# Patient Record
Sex: Male | Born: 1955 | Race: White | Hispanic: No | Smoking: Former smoker
Health system: Southern US, Community
[De-identification: ages and names within clinical notes are randomized; demographics above are authoritative.]

## PROBLEM LIST (undated history)

## (undated) DIAGNOSIS — E119 Type 2 diabetes mellitus without complications: Secondary | ICD-10-CM

## (undated) DIAGNOSIS — I10 Essential (primary) hypertension: Secondary | ICD-10-CM

## (undated) DIAGNOSIS — I219 Acute myocardial infarction, unspecified: Secondary | ICD-10-CM

## (undated) DIAGNOSIS — A879 Viral meningitis, unspecified: Secondary | ICD-10-CM

## (undated) DIAGNOSIS — L409 Psoriasis, unspecified: Secondary | ICD-10-CM

## (undated) DIAGNOSIS — B029 Zoster without complications: Secondary | ICD-10-CM

## (undated) HISTORY — PX: CHOLECYSTECTOMY: SHX55

---

## 1998-02-14 ENCOUNTER — Inpatient Hospital Stay (HOSPITAL_COMMUNITY): Admission: EM | Admit: 1998-02-14 | Discharge: 1998-02-17 | Payer: Self-pay | Admitting: Emergency Medicine

## 1998-03-07 ENCOUNTER — Ambulatory Visit (HOSPITAL_COMMUNITY): Admission: RE | Admit: 1998-03-07 | Discharge: 1998-03-07 | Payer: Self-pay | Admitting: General Surgery

## 2001-02-09 ENCOUNTER — Emergency Department (HOSPITAL_COMMUNITY): Admission: EM | Admit: 2001-02-09 | Discharge: 2001-02-09 | Payer: Self-pay | Admitting: Internal Medicine

## 2001-02-09 ENCOUNTER — Encounter: Payer: Self-pay | Admitting: Internal Medicine

## 2001-12-30 ENCOUNTER — Encounter: Payer: Self-pay | Admitting: Emergency Medicine

## 2001-12-30 ENCOUNTER — Inpatient Hospital Stay (HOSPITAL_COMMUNITY): Admission: EM | Admit: 2001-12-30 | Discharge: 2002-01-03 | Payer: Self-pay | Admitting: Emergency Medicine

## 2002-01-02 ENCOUNTER — Encounter: Payer: Self-pay | Admitting: Internal Medicine

## 2002-01-09 ENCOUNTER — Encounter: Payer: Self-pay | Admitting: Family Medicine

## 2002-01-09 ENCOUNTER — Ambulatory Visit (HOSPITAL_COMMUNITY): Admission: RE | Admit: 2002-01-09 | Discharge: 2002-01-09 | Payer: Self-pay | Admitting: Family Medicine

## 2003-09-06 ENCOUNTER — Emergency Department (HOSPITAL_COMMUNITY): Admission: EM | Admit: 2003-09-06 | Discharge: 2003-09-06 | Payer: Self-pay | Admitting: Emergency Medicine

## 2006-06-24 ENCOUNTER — Emergency Department (HOSPITAL_COMMUNITY): Admission: EM | Admit: 2006-06-24 | Discharge: 2006-06-24 | Payer: Self-pay | Admitting: Emergency Medicine

## 2006-12-09 ENCOUNTER — Inpatient Hospital Stay (HOSPITAL_BASED_OUTPATIENT_CLINIC_OR_DEPARTMENT_OTHER): Admission: RE | Admit: 2006-12-09 | Discharge: 2006-12-09 | Payer: Self-pay | Admitting: Cardiology

## 2007-09-29 DIAGNOSIS — A879 Viral meningitis, unspecified: Secondary | ICD-10-CM

## 2007-09-29 HISTORY — DX: Viral meningitis, unspecified: A87.9

## 2008-02-20 ENCOUNTER — Inpatient Hospital Stay (HOSPITAL_COMMUNITY): Admission: EM | Admit: 2008-02-20 | Discharge: 2008-02-24 | Payer: Self-pay | Admitting: Emergency Medicine

## 2008-03-21 ENCOUNTER — Emergency Department (HOSPITAL_COMMUNITY): Admission: EM | Admit: 2008-03-21 | Discharge: 2008-03-21 | Payer: Self-pay | Admitting: Emergency Medicine

## 2011-02-10 NOTE — Discharge Summary (Signed)
NAME:  Erik George, Erik George                ACCOUNT NO.:  [id number]   MEDICAL RECORD NO.:  [id number]          PATIENT TYPE:  INP   LOCATION:  A322                          FACILITY:  [facility]   PHYSICIAN:  Dorris Singh, DO    DATE OF BIRTH:  1956/07/29   DATE OF ADMISSION:  02/20/2008  DATE OF DISCHARGE:  05/29/2009LH                               DISCHARGE SUMMARY   PRIMARY CARE PHYSICIAN:  He does not have one.  He goes to [facility].   ADMISSION DIAGNOSES:  1. Headache and fever.  2. Possible [facility] spotted fever disease.  3. Possible right lower lobe pneumonia.  4. History of hypertension, diabetes and obesity.   DISCHARGE DIAGNOSES:  1. Lyme disease.  2. Possible [facility] spotted fever.  3. Hypertension.  4. Diabetes.  5. Obesity.  6. Headaches, which were resolving.   His H&P was done by Dr. Rito Ehrlich.  Please refer.  The patient had  several tests done.  His chest x-ray on May 25 demonstrated a possible  right suprahilar infiltrate or small mass.  Short-term radiographic  follow-up was recommended.  If this persists after appropriate therapy,  CT would exclude neoplasm.  Probable scarring of the left lung base.  His CT of the head without contrast demonstrated no acute intracranial  findings, mild mucosal thickening and possible small mucus retention in  a cyst of the paranasal sinuses is described.  He had a of fluoroscopic-  guided lumbar puncture on May 26.   CONSULTATIONS:  Kofi A. Gerilyn Pilgrim, M.D., of neurology.   HOSPITAL COURSE:  The patient was admitted with the above diagnoses.  There was some concern as to whether or not the patient had Lyme disease  because he is status post a tick bite about 2 weeks ago and his headache  had started about 4 days.  They were concerned about it being bacterial  meningitis.  At that point in time after the lumbar puncture, Dr.  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  At that point time it was determined that the  patient probably should get a PICC because if we need to do long-term  therapy, then we can continue that.  There was some concern about him  wheezing.  However, he was put on nebulizer treatments that seemed to  resolve it.  While he was here with his diabetes, he was placed on his  home medications as well as for his blood pressure.  The patient's  headache continued to improve slowly and did get some benefit with  hydration.  On the 29th it was determined he could be discharged.  His  Lyme disease titer came back negative.  However, we are awaiting the PCR  Lyme disease titer as well and the [facility] spotted fever titer.  Talking with Dr. Gerilyn Pilgrim, there is still high suspicion for these  diseases and since he has gotten better with the treatment, we will go  ahead and treat him for both and send him home.   He will be sent home on his home medications, which include:  1. Benicar 20/12.5 mg tablets daily.  2. Diclofenac 75 mg one p.o. b.i.d.  3. He is going to be on Glucovance 5/500 mg two times a day.   We will place him on doxycycline 100 mg p.o. b.i.d. x10 days as well as  ceftriaxone 2 g IV daily for the next 3 weeks.  The patient will have a  PICC line placed prior to discharge, and he is recommend to follow up  with Dr. Gerilyn Pilgrim in 1 week as well with this primary care physician in  1 week.   CONDITION AT DISCHARGE:  Stable.   DISPOSITION:  To home.  He is to return if any symptoms worsen.      Dorris Singh, DO  Electronically Signed     CB/MEDQ  D:  02/24/2008  T:  02/24/2008  Job:  [id number]   cc:   [facility] Department

## 2011-02-10 NOTE — Group Therapy Note (Signed)
NAME:  Erik George, Erik George                ACCOUNT NO.:  [id number]   MEDICAL RECORD NO.:  [id number]          PATIENT TYPE:  INP   LOCATION:  A322                          FACILITY:  [facility]   PHYSICIAN:  Osvaldo Shipper, MD     DATE OF BIRTH:  March 03, 1956   DATE OF PROCEDURE:  02/21/2008  DATE OF DISCHARGE:                                 PROGRESS NOTE   SUBJECTIVE:  The patient still complaining of 8/10 headache though he  says with the oxycodone it does get a little bit better.  He denies any  more nausea, vomiting.  No neck stiffness is present.  Denies any chest  pain or shortness of breath.   PHYSICAL EXAMINATION:  VITAL SIGNS:  He continues to run a temperature,  T-max 102.8.  Heart rate 83, respiratory rate 20, blood pressure on  133/84, saturation 95% on room air.  CBGs are running stable.  LUNGS:  His lungs reveal a few scattered wheezes bilaterally.  CARDIOVASCULAR:  Cardiovascular system is normal regular.  GENERAL:  He is sweating profusely.  No neck stiffness present.  No  focal weakness is present.   LABORATORY DATA:  His white count continues to be normal.  Hemoglobin is  7.8.  His BMET is also unremarkable.  Potassium has come up to 4.2.   An LP is pending at this time.   ASSESSMENT/PLAN:  1. Headaches with fever.  This is status post a tick bite 2 weeks ago.      His headache started 4 days ago.  It does not look like bacterial      meningitis.  An LP yesterday could not be done because of his obese      status, and no landmarks could be identified.  He is scheduled for      LP under fluoroscopy today.  He was seen by Dr. Gerilyn Pilgrim, who also      feels that this is not bacterial meningitis and could be Lyme      disease or [facility] spotted fever meningitis.  So the patient      is on doxycycline.  He is also on ceftriaxone, both of which will      be continued for now.  Await results from the LP to decide further      course of management.  Motrin will be started for  his headaches.  2. Wheezing.  He has been a smoker, quit about 3 months ago.  Has      almost a 40-pack-year history of smoking and so we will give him      some nebulizer treatments for now.  3. History of type 2 diabetes, stable.  He was on metformin at home,      but I am holding these medications for now.  4. History of hypertension, also stable.  I will hold off on his blood      pressure medications for now as well.  5. He is obese.  He has had pneumonia in the past, he has psoriasis,      all of which are quite  stable.   So we await LP on this gentleman before deciding further course of  action.      Osvaldo Shipper, MD  Electronically Signed     GK/MEDQ  D:  02/21/2008  T:  02/21/2008  Job:  [id number]   cc:   Darleen Crocker A. Gerilyn Pilgrim, M.D.  Fax: [phone]

## 2011-02-10 NOTE — Consult Note (Signed)
NAME:  Erik George, Erik George                ACCOUNT NO.:  [id number]   MEDICAL RECORD NO.:  [id number]          PATIENT TYPE:  INP   LOCATION:  A322                          FACILITY:  [facility]   PHYSICIAN:  Kofi A. Gerilyn Pilgrim, M.D. DATE OF BIRTH:  1955/12/07   DATE OF CONSULTATION:  DATE OF DISCHARGE:                                 CONSULTATION   HISTORY OF PRESENT ILLNESS:  The patient is a 55 year old white male who  probably was bitten by a tick about 2 weeks ago.  It had been on the  left flank region.  He  currently had a rash therapy, subsequently he  had a discoloration at that site where the tick had bitten him.  About 4  days prior to presenting to the hospital, the patient developed  significant headaches which is in his entire head.  He reports having  body aches.  He described the intensity of headache has been 8/10.  He  took the current medications without much relief.  The patient's started  to getting nausea, vomiting and presenting to the hospital after he  developed significant fevers.  No focal or neurological symptoms  reported.  No seizures.  He has not traveled outside the [place]  The  patient was noted to have right lower lobe infiltrate/pneumonia on the  chest x-ray, this felt related to his nausea and vomiting.   PAST MEDICAL HISTORY:  Significant for diabetes, hypertension, obesity,  pneumonia in the past, and psoriasis.   PAST SURGICAL HISTORY:  Status post cholecystectomy.   ADMISSION MEDICATIONS:  1. Benicar.  2. Aspirin.  3. Glucophage.  4. Topical creams for psoriasis.  5. Also over-the-couter ibuprofen.   SOCIAL HISTORY:  He lives in [place].  No smoking, alcohol, or  illicit drug use.  He is currently unemployed.   FAMILY HISTORY:  Positive for diabetes.   REVIEW OF SYSTEMS:  Essentially unrevealing other than stated in the  history of present illness.   PHYSICAL EXAMINATION:  GENERAL:  He is an obese man who has a discomfort  from headaches.  VITAL  SIGNS:  Temperature 102.1, pulse 83, respirations 20, and blood  pressure 132/84.  HEENT:  Head is normocephalic and atraumatic.  NECK:  Supple.  EXTREMITIES:  Psoriatic lesions involving the extensor surfaces of the  elbows and knees, especially on the right side.  TRUNK EXAM:  Shows a crusted areas involving the left flank region  measured about 1 cm.  This area where he had the tick bite, there is no  associated rash, however.  ABDOMEN:  Soft, no rashes other than his psoriasis noted on the trunk.  NEUROLOGIC:  The patient is awake, alert.  He converses well.  Speech,  language, and cognition are intact.  CRANIAL NERVES EVALUATION:  Pupils are equal, round, and reactive to  light and accommodation.  Visual fields are intact.  Extraocular  movements were full.  Face and muscle strength is symmetric.  Tongue is  midline.  Uvula is midline.  Shoulder shrug is normal.  Motor  examination shows normal tone, bulk, and strength.  There is  no pronator  drift.  Coordination is intact.  Reflexes are diminished throughout.  The patient has symmetric.   ASSESSMENT:  1. Likely meningitis, although I did not suspect this patient has      fulminant acute bacterial meningitis, likely this is a subacute      meningitis problem.  2. Tick-borne process (Lyme disease, or richettsial infections).  The      patient is currently on meningitic doses of ceftriaxone.  He is      also on doxycycline.  Apparently, on Zithromycin because of      suspected pneumonia.  Continue with these.  He is due to have a      lumbar spinal tap under fluoroscopy as attempts were unsuccessful.      We will add Lyme PCR and he also should have Lyme titers and      richettsial serum antibodies.      Kofi A. Gerilyn Pilgrim, M.D.  Electronically Signed     KAD/MEDQ  D:  02/21/2008  T:  02/22/2008  Job:  [id number]

## 2011-02-10 NOTE — H&P (Signed)
NAME:  Erik George, Erik George NO.:  [id number]   MEDICAL RECORD NO.:  [id number]          PATIENT TYPE:  EMS   LOCATION:  ED                            FACILITY:  [facility]   PHYSICIAN:  Osvaldo Shipper, MD     DATE OF BIRTH:  1955/12/28   DATE OF ADMISSION:  02/20/2008  DATE OF DISCHARGE:  LH                              HISTORY & PHYSICAL   PRIMARY CARE PHYSICIAN:  [facility] Department.  Does not  have a PMD.   ADMISSION DIAGNOSES:  1. Headache and fevers.  2. Possible [place] Spotted Disease.  3. Possible right lower lobe pneumonia.  4. History of hypertension, diabetes and obesity.   CHIEF COMPLAINT:  Headache and fevers for 4 days.   HISTORY OF PRESENT ILLNESS:  The patient is a 55 year old Caucasian male  who says that he had a tick bite to his left flank area about 2 weeks  ago.  He felt okay.  Since that bite he did not notice any rash.  He  does have psoriasis but did not notice any new rash.  Four days ago, the  patient started getting a headache all over his head.  The pain is  described as an achy kind of pain, 8/10 intensity.  He took some  ibuprofen for the headache with no relief.  Headache got worse and then  he started having nausea and vomiting as well, and he decided to come  into the hospital today.  He also started getting high fevers as well.  He denies any abdominal pain.  Denies any chest pain, shortness of  breath, cough.  Denies any seizure-type activity.  Denies any focal  weakness.  No sick contacts.  No recent travel outside this area.   MEDICATIONS AT HOME:  1. Aspirin 81 mg daily.  2. Benicar 5 mg daily.  3. Glucovance 500 b.i.d.  4. He takes a cream, applied the cream for psoriasis.  5. He took some ibuprofen over-the-counter for his headache.   ALLERGIES:  NO KNOWN DRUG ALLERGIES.   PAST MEDICAL HISTORY:  1. Diabetes.  2. Obesity.  3. Hypertension.  4  Pneumonia in the past.  1. Psoriasis.   PAST SURGICAL  HISTORY:  He had a cholecystectomy in the past.   SOCIAL HISTORY:  Lives in [place].  No smoking, alcohol or illicit  drug use.  He is currently unemployed.   FAMILY HISTORY:  Positive for diabetes.   REVIEW OF SYSTEMS:  GENERAL:  System positive for weakness.  HEENT: As  in HPI.  GI: As in HPI.  GU: Unremarkable.  RESPIRATORY:  Unremarkable.  CARDIOVASCULAR:  Unremarkable.  PSYCHIATRIC:  Unremarkable.  NEUROLOGICAL:  As in HPI.  Other systems unremarkable.   PHYSICAL EXAMINATION:  VITAL SIGNS: Temperature was 103 and he presented  going down to 101.2, blood pressure was 139/75, heart rate 93,  respiratory 24, saturation 96% on room air.  GENERAL:  This is an obese white male in considerable discomfort but in  no distress.  HEENT: There is no pallor, no icterus.  Oral mucous membrane  is moist.  No oral lesions are noted.  The patient has sweating profusely.  NECK:  Soft and supple.  No rigidity is noted.  No thyromegaly is  appreciated.  LUNGS:  Clear.  No wheezing is present.  Few crackles at the right base.  Otherwise, mostly clear to auscultation.  Cardiovascular system is  normal regular.  No murmurs appreciated.  ABDOMEN:  Soft, nontender, nondistended.  Bowel sounds present.  No mass  or organomegaly is appreciated.  SKIN:  There is a tick bite mark noted in the left flank area.  No  erythema is noted.  The area is mildly tender.  He also has psoriasis  rash in his elbow area as well as in his navel area on his abdomen and  some in his lower extremity as well.  NEUROLOGICAL:  He is alert, somnolent, easily arousable, oriented x3.  No focal neurological deficits are present.   LABORATORY DATA:  His white count is normal, hemoglobin is 13.4, MCV is  89, platelet count is 160, PTT is 50, potassium 3.4, glucose 150,  bilirubin is 1.6, alk phos is 25.  Other LFTs are normal.  Lipase is  normal.  UA shows a small bilirubin, trace ketones, moderate blood, no  evidence for  infection.  He had chest x-ray which shows possible right  suprahilar infiltrative small mass, possible scattered scarring of the  left lung base noted.  CT of the head was done which showed no acute  findings.   ASSESSMENT:  This is a 55 year old Caucasian male who presents with  headache, fever, vomiting and is found to have a possible pneumonia.  All this history is preceded by tick bite two weeks ago.  So this  patient could definitely have tick-borne illness in the form of [facility] Spotted Fever or even Lyme's.  His pneumonia is probably  secondary to aspiration.  I do not think this was the initial event to  begin with.   PLAN:  Fever with headache.  We will treat this as if this is a tick-  borne illness.  We will treat him with ceftriaxone and doxycycline.  Will also give him azithromycin which will cover the treatment for  community-acquired pneumonia as well.  I did evaluate this patient for  an LP.  However, he is a pretty obese gentleman and this is going to be  very difficult to do this to the LP without help from imaging studies.  So the plan will be to do an LP under fluoroscopy.  I do not suspect  this is bacterial meningitis considering no neck signs and normal white  count.  His other medical issues include diabetes and hypertension, all  of which are stable.  We will hold off on all of his other  medications at this time.  Will replace his potassium which is a little  low.  Chest x-ray may have to be repeated or CAT scan of the chest may  have to be considered.  Neurological opinion will be sought in the  morning.   If the patient does not improve, consideration may be given to sending  him to a tertiary level Center.      Osvaldo Shipper, MD  Electronically Signed     GK/MEDQ  D:  02/20/2008  T:  02/20/2008  Job:  [id number]

## 2011-02-13 NOTE — Discharge Summary (Signed)
[facility]  Patient:    Erik George, Erik George Visit Number: [id number] MRN: [id number]          Service Type: MED Location: [phone] Attending Physician:  Renne Musca Admit Date:  12/30/2001 Disc. Date: 01/03/02   CC:         Butch Penny, M.D.   Discharge Summary  DISCHARGE DIAGNOSES: 1. Pneumonia. 2. Elevated liver function tests. 3. Hyperglycemia. 4. Hyperkalemia. 5. Tobacco use.  HISTORY:  The patient is a 55 year old white male who presented to the emergency room on December 30, 2001, complaining of increasing cough, upper respiratory congestion and fever.  The patient was found to have bilateral infiltrates on his chest x-ray.  He had a temperature of 102.0.  He was admitted for treatment of pneumonia.  For details of admission please see dictated history and physical.  PAST MEDICAL HISTORY:  Essentially negative.  PHYSICAL EXAMINATION:  HEENT:  At the time of admission HEENT was noted for a prominent parotid glands but nontender.  NECK:  Supple.  CHEST AND LUNGS:  Had diffuse inspiratory and expiratory wheezes.  CARDIAC:  Regular rate and rhythm.  ABDOMEN:  Nontender.  EXTREMITIES:  No edema.  NEUROLOGIC:  Nonfocal.  CHEST X-RAY:  Showed borderline cardiomegaly with mild peribronchial thickening and increased interstitial markings suggestive of bronchitis with a mild elevation of the right hemidiaphragm.  An ABG on room air showed a pH of 7.47, pCO2 of 35, pO2 76, with 96% saturation.  White count was 3.2, hemoglobin 13.8, hematocrit 38.  Platelets 140.  Sodium 139, potassium 3.1, chloride 108, CO2 of 26, glucose of 183.  BUN 12, creatinine 1.0, calcium 8.5, total protein 7.0, and albumin 3.3.  AST was 48, ALT 52, alk. phos. 47, and total bilirubin of 0.9.  Magnesium was 1.9.  HOSPITAL COURSE:  The patient initially was placed in respiratory isolation. He had a PPD placed which was negative.  He was started on Zithromax  and levaquin and received IV steroids and albuterol nebulizers and oxygen supplementation.  The patient initially was admitted to the ICU for close monitoring.  From a respiratory standpoint the patient clinically improved. He was changed to p.o. steroids.  As the patient does have a history of tobacco use he was counselled regarding smoking cessation.  PHYSICAL EXAMINATION:  On the day of discharge the patient had no respiratory symptoms and lung fields were clear with no rales, wheezes or rhonchi heard. In addition the patient did have mildly elevated blood sugars during his hospital course.  Hemoglobin A1c was normal at 6.  This was felt secondary to infection with worsening of finger stick blood sugars felt secondary to steroids.   This can be followed up as an outpatient.  The patient was covered with a sliding scale of insulin during his hospitalization.  The patient also had noted mild transaminase elevations.  He had no GI symptoms.  A hepatitis panel was negative.  This also may resolve once his infection clears.  The patients hypokalemia was supplemented p.o. and IV.  The most recent potassium value was 4.4, on January 02, 2002.  As patient has clinically improved arrangements are in progress for the patient to be discharged home as he has no local physician.  Arrangements have been made for the patient to follow up with Dr. Renard Matter.  Additional studies performed during patients hospitalization included two sets of blood cultures which are negative to date, and a urine Legionella antigen which was negative as well.  DISCHARGE MEDICATIONS: 1. Levaquin 500 mg q.d. x6 days. 2. Prednisone taper at 40 mg q. day x3 days, 20 mg x3 days, 10 mg x3 days and    stop. 3. Combivent MDI 3 puffs q.6h. 4. Flovent inhaler 2 puffs b.i.d.  FOLLOWUP: The patient is to follow up with Dr. Renard Matter on Monday, January 09, 2002, at 2 p.m. Attending Physician:  Renne Musca DD:  01/03/02 TD:   01/03/02 Job: [id number]

## 2011-02-13 NOTE — H&P (Signed)
[facility]  Patient:    Erik George, Erik L. Visit Number: [id number] MRN: [id number]          Service Type: Attending:  Renne Musca, M.D. Dictated by:   Renne Musca, M.D. Adm. Date:  12/30/01                           History and Physical  DATE OF BIRTH:  [birth year]  CHIEF COMPLAINT:  The patient is a healthy 55 year old Caucasian male, who was in his usual state of health until approximately one week ago when he awakened with fever.  HISTORY OF PRESENT ILLNESS:  He developed cough and upper respiratory congestion, which persisted.  The fever continued throughout the week, though he continued to work.  He had no associated nausea, vomiting, diarrhea, pain. He had had progressive shortness of breath.  Finally at the insistence of his family he came to the emergency room by private vehicle this evening.  Upon presentation the patient was in significant respiratory distress.  Though his room air saturation was 96% he was dyspneic in appearance.  Respiratory rate 24.  Temperature 102 degrees.  Hemodynamically blood pressure was 120/72.  The patient was treated with Motrin, Decadron, and Rocephin as well as oxygen supplementation.  Chest x-ray reveals diffuse interstitial infiltrate with some air bronchograms, particularly on the left; no evidence of effusion (this is an AP film), and there is some cardiomegaly and poor inspiratory effort. The patient is being admitted to the intensive care unit for ongoing treatment.  The patient did have a friend apparently in [place] and there was some confusion as to his contact; however, that person still remains in that country and therefore this point is mute and it is unlikely he has SARS.  The patient did have a flu vaccine this year.  He works at [employer] and actually was able to work up until today.  REVIEW OF SYSTEMS:  Essentially negative with the exception of some left upper flank pain,  which he has had intermittently that lasts about five minutes, described as sharp and stabbing, and has been present for approximately six months but never to the point that he felt he needed medical attention.  MEDICATIONS (which he has been using this week):  1. Motrin.  2. Tylenol.  SOCIAL HISTORY:  He is divorced.  He has two daughters, ages 60 and 40, in good health.  He smokes approximately one pack over three to four days.  No alcohol or drug abuse.  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  He is not sexually active.  He has had no travel and no contacts.  PAST MEDICAL/SURGICAL HISTORY:  Cholecystectomy is the only hospitalization. He is otherwise in good health.  He has no local doctor.  FAMILY HISTORY:  Noncontributory.  PHYSICAL EXAMINATION:  GENERAL:  Dyspneic appearing gentleman.  He is obese.  VITAL SIGNS:  O2 saturation 96% on 2 liters.  Respiratory rate 24.  HEENT/NECK:  No JVD, adenopathy, or thyromegaly.  He has some prominent parotid glands, particularly on the right, though nontender.  No conjunctivitis.  Oropharynx is moist.  Neck without bruits.  There is no axillary, inguinal, or cervical adenopathy.  SKIN:  Without rash, lesion, or breakdown.  LUNGS:  Diffuse inspiratory and expiratory wheeze with poor air exchange bilaterally.  No rales are present.  HEART:  Regular.  Rate is 90.  ABDOMEN:  Protuberant.  Soft.  Few bowel  sounds.  Nontender.  EXTREMITIES:  No clubbing, cyanosis, or edema.  Distal pulses intact.  NEUROLOGIC:  Grossly intact, although gait is not tested.  He did walk into the emergency room.  LABORATORY DATA:  The reader is referred to the electronic record.  ASSESSMENT/PLAN:  1. Atypical pneumonia.  Zithromax and Levaquin.  Continue intravenous     Decadron and albuterol nebulizers and oxygen supplementation.  The patient     will be admitted to the intensive care unit for close monitoring.  2. Mild transaminase elevation,  most likely a result of his underlying     problem.  Will check follow-up.  May consider ultrasound if this persists.  3. Glucose 183.  No history of diabetes.  Check hemoglobin A1C.  Will need     follow-up as an outpatient.  4. Hypokalemia.  His pCO2 is 26 and pO2 is 36.  Will supplement with     potassium both intravenously and p.o. as tolerates.  5. Intermittent left flank pain x6 months.  Will need to be addressed if it     persists or occurs during hospitalization. Dictated by:   Renne Musca, M.D. Attending:  Renne Musca, M.D. DD:  12/30/01 TD:  12/31/01 Job: [id number]

## 2011-06-24 LAB — COMPREHENSIVE METABOLIC PANEL WITH GFR
Albumin: 3.5
Alkaline Phosphatase: 25 — ABNORMAL LOW
BUN: 21
Calcium: 8.8
Creatinine, Ser: 1.07
Potassium: 3.4 — ABNORMAL LOW
Total Protein: 7.6

## 2011-06-24 LAB — CBC
HCT: 31.3 — ABNORMAL LOW
HCT: 33.4 — ABNORMAL LOW
HCT: 37.9 — ABNORMAL LOW
Hemoglobin: 13.4
MCHC: 35.4
MCHC: 36.1 — ABNORMAL HIGH
MCV: 88.5
MCV: 89.4
MCV: 89.6
Platelets: 150
Platelets: 160
Platelets: 173
RBC: 3.45 — ABNORMAL LOW
RBC: 4.23
RDW: 13.1
RDW: 13.3
RDW: 13.4
WBC: 3.5 — ABNORMAL LOW
WBC: 7.6

## 2011-06-24 LAB — CULTURE, BLOOD (ROUTINE X 2)
Culture: NO GROWTH
Culture: NO GROWTH
Report Status: 5302009
Report Status: 5302009

## 2011-06-24 LAB — BASIC METABOLIC PANEL
BUN: 15
BUN: 19
CO2: 25
Calcium: 8.1 — ABNORMAL LOW
Chloride: 104
Creatinine, Ser: 0.74
Creatinine, Ser: 0.81
Creatinine, Ser: 0.88
GFR calc Af Amer: >60
GFR calc non Af Amer: >60
GFR calc non Af Amer: >60
Glucose, Bld: 103 — ABNORMAL HIGH
Potassium: 3.8

## 2011-06-24 LAB — ROCKY MTN SPOTTED FVR AB, IGG-BLOOD: RMSF IgG: <1:64 {titer}

## 2011-06-24 LAB — URINALYSIS, ROUTINE W REFLEX MICROSCOPIC
Glucose, UA: 250 — AB
Leukocytes, UA: NEGATIVE
Nitrite: NEGATIVE
Protein, ur: 100 — AB
Specific Gravity, Urine: >1.03 — ABNORMAL HIGH
Urobilinogen, UA: 4 — ABNORMAL HIGH
pH: 6

## 2011-06-24 LAB — HSV PCR

## 2011-06-24 LAB — DIFFERENTIAL
Basophils Absolute: 0
Basophils Absolute: 0
Basophils Absolute: 0
Basophils Relative: 0
Basophils Relative: 1
Eosinophils Absolute: 0
Eosinophils Absolute: 0
Eosinophils Absolute: 0.1
Eosinophils Relative: 0
Eosinophils Relative: 0
Eosinophils Relative: 1
Lymphocytes Relative: 15
Lymphocytes Relative: 27
Lymphs Abs: 0.8
Lymphs Abs: 1.1
Monocytes Absolute: 0.7
Monocytes Relative: 9
Monocytes Relative: 9
Neutro Abs: 2.3
Neutro Abs: 5.7
Neutrophils Relative %: 63
Neutrophils Relative %: 65
Neutrophils Relative %: 76

## 2011-06-24 LAB — URINE CULTURE: Colony Count: 2000

## 2011-06-24 LAB — COMPREHENSIVE METABOLIC PANEL
ALT: 31
AST: 28
CO2: 25
Chloride: 103
GFR calc non Af Amer: >60
Glucose, Bld: 150 — ABNORMAL HIGH
Sodium: 135
Total Bilirubin: 1.6 — ABNORMAL HIGH

## 2011-06-24 LAB — HEPATIC FUNCTION PANEL
Indirect Bilirubin: 0.2 — ABNORMAL LOW
Total Protein: 6.2

## 2011-06-24 LAB — URINE MICROSCOPIC-ADD ON

## 2011-06-24 LAB — CSF CELL COUNT WITH DIFFERENTIAL
Eosinophils, CSF: 0
Eosinophils, CSF: 0
Monocyte-Macrophage-Spinal Fluid: 5 — ABNORMAL LOW
Monocyte-Macrophage-Spinal Fluid: 7 — ABNORMAL LOW
Other Cells, CSF: 2
Tube #: 1
Tube #: 4
WBC, CSF: 82 — ABNORMAL HIGH

## 2011-06-24 LAB — ROCKY MTN SPOTTED FVR AB, IGM-BLOOD

## 2011-06-24 LAB — LIPASE, BLOOD: Lipase: 20

## 2011-06-24 LAB — PROTIME-INR: INR: 1

## 2011-06-24 LAB — CSF CULTURE W GRAM STAIN: Culture: NO GROWTH

## 2011-06-24 LAB — ANTI-RIBONUCLEIC ACID ANTIBODY: Ribonucleic Protein(ENA) Antibody, IgG: <0.2 AI (ref <1.0)

## 2011-06-24 LAB — KETONES, QUALITATIVE: Acetone, Bld: NEGATIVE

## 2011-06-24 LAB — LYME DISEASE DNA BY PCR(BORRELIA BURG)

## 2012-07-24 ENCOUNTER — Inpatient Hospital Stay (HOSPITAL_COMMUNITY)
Admission: EM | Admit: 2012-07-24 | Discharge: 2012-07-26 | DRG: 277 | Disposition: A | Discharge status: Home / Self Care | Payer: BC Managed Care – PPO | Attending: Internal Medicine | Admitting: Internal Medicine

## 2012-07-24 ENCOUNTER — Encounter (HOSPITAL_COMMUNITY): Payer: Self-pay

## 2012-07-24 DIAGNOSIS — L02419 Cutaneous abscess of limb, unspecified: Principal | ICD-10-CM | POA: Diagnosis present

## 2012-07-24 DIAGNOSIS — Z8661 Personal history of infections of the central nervous system: Secondary | ICD-10-CM

## 2012-07-24 DIAGNOSIS — I10 Essential (primary) hypertension: Secondary | ICD-10-CM | POA: Diagnosis present

## 2012-07-24 DIAGNOSIS — L408 Other psoriasis: Secondary | ICD-10-CM | POA: Diagnosis present

## 2012-07-24 DIAGNOSIS — L409 Psoriasis, unspecified: Secondary | ICD-10-CM | POA: Diagnosis present

## 2012-07-24 DIAGNOSIS — F17200 Nicotine dependence, unspecified, uncomplicated: Secondary | ICD-10-CM | POA: Diagnosis present

## 2012-07-24 DIAGNOSIS — Z7982 Long term (current) use of aspirin: Secondary | ICD-10-CM

## 2012-07-24 DIAGNOSIS — Z23 Encounter for immunization: Secondary | ICD-10-CM

## 2012-07-24 DIAGNOSIS — E119 Type 2 diabetes mellitus without complications: Secondary | ICD-10-CM | POA: Diagnosis present

## 2012-07-24 DIAGNOSIS — IMO0002 Reserved for concepts with insufficient information to code with codable children: Secondary | ICD-10-CM | POA: Diagnosis present

## 2012-07-24 DIAGNOSIS — M79604 Pain in right leg: Secondary | ICD-10-CM

## 2012-07-24 DIAGNOSIS — Z79899 Other long term (current) drug therapy: Secondary | ICD-10-CM

## 2012-07-24 DIAGNOSIS — L0390 Cellulitis, unspecified: Secondary | ICD-10-CM | POA: Diagnosis present

## 2012-07-24 HISTORY — DX: Type 2 diabetes mellitus without complications: E11.9

## 2012-07-24 HISTORY — DX: Essential (primary) hypertension: I10

## 2012-07-24 HISTORY — DX: Psoriasis, unspecified: L40.9

## 2012-07-24 HISTORY — DX: Viral meningitis, unspecified: A87.9

## 2012-07-24 MED ORDER — ONDANSETRON HCL 4 MG/2ML IJ SOLN
4.0000 mg | Freq: Once | INTRAMUSCULAR | Status: AC
  Administered 2012-07-24: 4 mg via INTRAVENOUS
  Filled 2012-07-24: qty 2

## 2012-07-24 MED ORDER — SODIUM CHLORIDE 0.9 % IV SOLN
Freq: Once | INTRAVENOUS | Status: AC
  Administered 2012-07-25: 75 mL/h via INTRAVENOUS

## 2012-07-24 MED ORDER — HYDROMORPHONE HCL PF 1 MG/ML IJ SOLN
1.0000 mg | Freq: Once | INTRAMUSCULAR | Status: AC
  Administered 2012-07-24: 1 mg via INTRAVENOUS
  Filled 2012-07-24: qty 1

## 2012-07-24 NOTE — ED Notes (Signed)
Woke this morning feeling feverish, having pain on right leg from inner thigh to ankle. Had 103 temp this afternoon. C/o of total body aches.

## 2012-07-24 NOTE — ED Provider Notes (Signed)
History   This chart was scribed for [employer]. Erik Branch, MD by Erik George. The patient was seen in room APA06/APA06 and the patient's care was started at 11:30PM.    CSN: [id number]  Arrival date & time 07/24/12  2258   First MD Initiated Contact with Patient 07/24/12 2314      Chief Complaint  Patient presents with  . Leg Pain    (Consider location/radiation/quality/duration/timing/severity/associated sxs/prior treatment) The history is provided by the patient. No language interpreter was used.   Erik George is a 56 y.o. male who presents to the Emergency Department complaining of constant,  right leg pain radiating from the thigh to the ankle onset this morning. No mechanism of injury or trauma to the leg; he reports waking up with the pain. He has a Hx of psoriasis. Redness is worse compared to baseline. Reports a fever of  103. Hx of DM and HTN. No other pertinent medical symptoms.  PCP: Dr. Arlyce George in [place]  Past Medical History  Diagnosis Date  . Hypertension   . Diabetes mellitus without complication   . Psoriasis     Past Surgical History  Procedure Date  . Cholecystectomy     History reviewed. No pertinent family history.  History  Substance Use Topics  . Smoking status: Current Every Day Smoker -- 0.5 packs/day for 25 years    Types: Cigarettes  . Smokeless tobacco: Not on file  . Alcohol Use: No      Review of Systems  Constitutional: Negative for fever.       10 Systems reviewed and are negative for acute change except as noted in the HPI.  HENT: Negative for congestion.   Eyes: Negative for discharge and redness.  Respiratory: Negative for cough and shortness of breath.   Cardiovascular: Negative for chest pain.  Gastrointestinal: Negative for vomiting and abdominal pain.  Musculoskeletal: Negative for back pain.  Skin: Negative for rash.  Neurological: Negative for syncope, numbness and headaches.  Psychiatric/Behavioral:       No  behavior change.   10 Systems reviewed and all are negative for acute change except as noted in the HPI.   Allergies  Codeine  Home Medications   Current Outpatient Rx  Name Route Sig Dispense Refill  . ASPIRIN 650 MG PO TBEC Oral Take 650 mg by mouth every 6 (six) hours as needed.    . ASPIRIN 81 MG PO TABS Oral Take 81 mg by mouth daily.    . OMEGA-3 FATTY ACIDS 1000 MG PO CAPS Oral Take 1 g by mouth daily.    . GLYBURIDE-METFORMIN 2.5-500 MG PO TABS Oral Take 1 tablet by mouth 2 (two) times daily with a meal.    . METFORMIN HCL 1000 MG PO TABS Oral Take 1,000 mg by mouth 2 (two) times daily with a meal.      BP 124/57  Pulse 87  Temp 99.1 F (37.3 C) (Oral)  Resp 18  Ht 6' (1.829 m)  Wt 241 lb (109.317 kg)  BMI 32.69 kg/m2  SpO2 95%  Physical Exam  Nursing note and vitals reviewed. Constitutional:       Awake, alert, nontoxic appearance.  HENT:  Head: Atraumatic.  Eyes: Right eye exhibits no discharge. Left eye exhibits no discharge.  Neck: Neck supple.  Pulmonary/Chest: Effort normal. He exhibits no tenderness.  Abdominal: Soft. There is no tenderness. There is no rebound.  Musculoskeletal: He exhibits tenderness (Right leg is diffusely tender from calf to thigh  with erythema from ankle to knee).       Baseline ROM, no obvious new focal weakness.  Neurological:       Mental status and motor strength appears baseline for patient and situation.  Skin: Rash (Psoriasis with plaques to bilateral hands, elbows, and knees.) noted.  Psychiatric: He has a normal mood and affect.    ED Course  Procedures (including critical care time)  DIAGNOSTIC STUDIES: Oxygen Saturation is 95% on room air, adequate by my interpretation.    COORDINATION OF CARE:  11:35PM - IV fluids, dilaudid, zofran, and blood w/u will be ordered for Erik George.  Results for orders placed during the hospital encounter of 07/24/12  CBC WITH DIFFERENTIAL      Component Value Range   WBC 12.9 (*)  4.0 - 10.5 K/uL   RBC 4.01 (*) 4.22 - 5.81 MIL/uL   Hemoglobin 12.6 (*) 13.0 - 17.0 g/dL   HCT 81.1 (*) 91.4 - 78.2 %   MCV 87.8  78.0 - 100.0 fL   MCH 31.4  26.0 - 34.0 pg   MCHC 35.8  30.0 - 36.0 g/dL   RDW 95.6  21.3 - 08.6 %   Platelets 124 (*) 150 - 400 K/uL   Neutrophils Relative 85 (*) 43 - 77 %   Neutro Abs 11.0 (*) 1.7 - 7.7 K/uL   Lymphocytes Relative 8 (*) 12 - 46 %   Lymphs Abs 1.0  0.7 - 4.0 K/uL   Monocytes Relative 7  3 - 12 %   Monocytes Absolute 0.9  0.1 - 1.0 K/uL   Eosinophils Relative 0  0 - 5 %   Eosinophils Absolute 0.0  0.0 - 0.7 K/uL   Basophils Relative 0  0 - 1 %   Basophils Absolute 0.0  0.0 - 0.1 K/uL  COMPREHENSIVE METABOLIC PANEL      Component Value Range   Sodium 134 (*) 135 - 145 mEq/L   Potassium 3.4 (*) 3.5 - 5.1 mEq/L   Chloride 99  96 - 112 mEq/L   CO2 25  19 - 32 mEq/L   Glucose, Bld 117 (*) 70 - 99 mg/dL   BUN 19  6 - 23 mg/dL   Creatinine, Ser 5.78  0.50 - 1.35 mg/dL   Calcium 8.8  8.4 - 46.9 mg/dL   Total Protein 7.1  6.0 - 8.3 g/dL   Albumin 3.4 (*) 3.5 - 5.2 g/dL   AST 16  0 - 37 U/L   ALT 20  0 - 53 U/L   Alkaline Phosphatase 31 (*) 39 - 117 U/L   Total Bilirubin 1.0  0.3 - 1.2 mg/dL   GFR calc non Af Amer >90  >90 mL/min   GFR calc Af Amer >90  >90 mL/min  CULTURE, BLOOD (ROUTINE X 2)      Component Value Range   Specimen Description BLOOD RIGHT ANTECUBITAL     Special Requests       Value: BOTTLES DRAWN AEROBIC AND ANAEROBIC AEB 10CC ANA 8CC   Culture PENDING     Report Status PENDING    CULTURE, BLOOD (ROUTINE X 2)      Component Value Range   Specimen Description BLOOD RIGHT HAND     Special Requests BOTTLES DRAWN AEROBIC AND ANAEROBIC 8CC EACH     Culture PENDING     Report Status PENDING    D-DIMER, QUANTITATIVE      Component Value Range   D-Dimer, Quant 0.27  0.00 -  0.48 ug/mL-FEU    12:46 AM:  T/C to Erik George,  Hospitalist, case discussed, including:  HPI, pertinent PM/SHx, VS/PE, dx testing, ED course and  treatment.  Agreeable to admission.Marland Kitchen  She will see the patient in the ER.  MDM  Patient with psoriasis, DM, HTN here with fever, right leg pain that began this morning associated with redness to the leg. He has an elevated WBC, tenderness with palpation of the right leg, warmth c/w cellulitis. Blood cultures pending. Initiated antibiotic therapy. Ordered US to r/o DVT. D-dimer is negative. Patient was given IVF, analgesic, antiemetic with some relief. Spoke with Erik George, hospitalist, who will admit the patient. Pt stable in ED with no significant deterioration in condition.The patient appears reasonably stabilized for admission considering the current resources, flow, and capabilities available in the ED at this time, and I doubt any other [facility] requiring further screening and/or treatment in the ED prior to admission.  I personally performed the services described in this documentation, which was scribed in my presence. The recorded information has been reviewed and considered.   MDM Reviewed: nursing note and vitals Interpretation: labs Total time providing critical care: 30 minutes.           Nicoletta Dress. Erik Branch, MD 07/25/12 [phone]

## 2012-07-25 ENCOUNTER — Observation Stay (HOSPITAL_COMMUNITY): Payer: BC Managed Care – PPO

## 2012-07-25 ENCOUNTER — Encounter (HOSPITAL_COMMUNITY): Payer: Self-pay | Admitting: *Deleted

## 2012-07-25 DIAGNOSIS — L409 Psoriasis, unspecified: Secondary | ICD-10-CM | POA: Diagnosis present

## 2012-07-25 DIAGNOSIS — L0390 Cellulitis, unspecified: Secondary | ICD-10-CM

## 2012-07-25 DIAGNOSIS — E119 Type 2 diabetes mellitus without complications: Secondary | ICD-10-CM | POA: Diagnosis present

## 2012-07-25 DIAGNOSIS — I10 Essential (primary) hypertension: Secondary | ICD-10-CM | POA: Diagnosis present

## 2012-07-25 LAB — CBC WITH DIFFERENTIAL/PLATELET
Eosinophils Absolute: 0 10*3/uL (ref 0.0–0.7)
Hemoglobin: 12.6 g/dL — ABNORMAL LOW (ref 13.0–17.0)
Lymphs Abs: 1 10*3/uL (ref 0.7–4.0)
MCH: 31.4 pg (ref 26.0–34.0)
Monocytes Relative: 7 % (ref 3–12)
Neutrophils Relative %: 85 % — ABNORMAL HIGH (ref 43–77)
RBC: 4.01 MIL/uL — ABNORMAL LOW (ref 4.22–5.81)

## 2012-07-25 LAB — COMPREHENSIVE METABOLIC PANEL
Alkaline Phosphatase: 31 U/L — ABNORMAL LOW (ref 39–117)
BUN: 19 mg/dL (ref 6–23)
CO2: 25 mEq/L (ref 19–32)
Chloride: 99 mEq/L (ref 96–112)
GFR calc Af Amer: >90 mL/min (ref >90)
GFR calc non Af Amer: >90 mL/min (ref >90)
Glucose, Bld: 117 mg/dL — ABNORMAL HIGH (ref 70–99)
Potassium: 3.4 mEq/L — ABNORMAL LOW (ref 3.5–5.1)
Total Bilirubin: 1 mg/dL (ref 0.3–1.2)
Total Protein: 7.1 g/dL (ref 6.0–8.3)

## 2012-07-25 LAB — CBC
HCT: 33 % — ABNORMAL LOW (ref 39.0–52.0)
MCH: 31 pg (ref 26.0–34.0)
MCHC: 34.8 g/dL (ref 30.0–36.0)
MCV: 88.9 fL (ref 78.0–100.0)
Platelets: 113 10*3/uL — ABNORMAL LOW (ref 150–400)
RDW: 13.5 % (ref 11.5–15.5)

## 2012-07-25 LAB — BASIC METABOLIC PANEL
CO2: 27 mEq/L (ref 19–32)
Calcium: 8.6 mg/dL (ref 8.4–10.5)
Creatinine, Ser: 0.98 mg/dL (ref 0.50–1.35)
GFR calc Af Amer: >90 mL/min (ref >90)
GFR calc non Af Amer: >90 mL/min (ref >90)
Sodium: 136 mEq/L (ref 135–145)

## 2012-07-25 LAB — MRSA PCR SCREENING: MRSA by PCR: NEGATIVE

## 2012-07-25 LAB — GLUCOSE, CAPILLARY
Glucose-Capillary: 148 mg/dL — ABNORMAL HIGH (ref 70–99)
Glucose-Capillary: 213 mg/dL — ABNORMAL HIGH (ref 70–99)

## 2012-07-25 LAB — D-DIMER, QUANTITATIVE: D-Dimer, Quant: 0.27 ug/mL-FEU (ref 0.00–0.48)

## 2012-07-25 MED ORDER — INSULIN ASPART 100 UNIT/ML ~~LOC~~ SOLN
0.0000 [IU] | Freq: Three times a day (TID) | SUBCUTANEOUS | Status: DC
  Administered 2012-07-25 (×2): 2 [IU] via SUBCUTANEOUS
  Administered 2012-07-26 (×2): 3 [IU] via SUBCUTANEOUS

## 2012-07-25 MED ORDER — ONDANSETRON HCL 4 MG/2ML IJ SOLN: 4.0000 mg | Freq: Four times a day (QID) | INTRAMUSCULAR | Status: DC | PRN

## 2012-07-25 MED ORDER — ZOLPIDEM TARTRATE 5 MG PO TABS: 5.0000 mg | ORAL_TABLET | Freq: Every evening | ORAL | Status: DC | PRN

## 2012-07-25 MED ORDER — OMEGA-3-ACID ETHYL ESTERS 1 G PO CAPS
1.0000 g | ORAL_CAPSULE | Freq: Every day | ORAL | Status: DC
  Administered 2012-07-25 – 2012-07-26 (×2): 1 g via ORAL
  Filled 2012-07-25 (×4): qty 1

## 2012-07-25 MED ORDER — POTASSIUM CHLORIDE CRYS ER 20 MEQ PO TBCR
40.0000 meq | EXTENDED_RELEASE_TABLET | Freq: Once | ORAL | Status: AC
  Administered 2012-07-25: 40 meq via ORAL
  Filled 2012-07-25: qty 2

## 2012-07-25 MED ORDER — ENOXAPARIN SODIUM 100 MG/ML ~~LOC~~ SOLN
100.0000 mg | Freq: Once | SUBCUTANEOUS | Status: AC
  Administered 2012-07-25: 100 mg via SUBCUTANEOUS
  Filled 2012-07-25: qty 1

## 2012-07-25 MED ORDER — ACETAMINOPHEN 650 MG RE SUPP: 650.0000 mg | Freq: Four times a day (QID) | RECTAL | Status: DC | PRN

## 2012-07-25 MED ORDER — ONDANSETRON HCL 4 MG PO TABS: 4.0000 mg | ORAL_TABLET | Freq: Four times a day (QID) | ORAL | Status: DC | PRN

## 2012-07-25 MED ORDER — VANCOMYCIN HCL IN DEXTROSE 1-5 GM/200ML-% IV SOLN
1000.0000 mg | Freq: Three times a day (TID) | INTRAVENOUS | Status: DC
  Administered 2012-07-25 – 2012-07-26 (×4): 1000 mg via INTRAVENOUS
  Filled 2012-07-25 (×7): qty 200

## 2012-07-25 MED ORDER — ASPIRIN EC 81 MG PO TBEC
81.0000 mg | DELAYED_RELEASE_TABLET | Freq: Every day | ORAL | Status: DC
  Administered 2012-07-25 – 2012-07-26 (×2): 81 mg via ORAL
  Filled 2012-07-25 (×2): qty 1

## 2012-07-25 MED ORDER — METHYLPREDNISOLONE SODIUM SUCC 125 MG IJ SOLR
125.0000 mg | Freq: Once | INTRAMUSCULAR | Status: AC
  Administered 2012-07-25: 125 mg via INTRAVENOUS
  Filled 2012-07-25: qty 2

## 2012-07-25 MED ORDER — ENOXAPARIN SODIUM 40 MG/0.4ML ~~LOC~~ SOLN
40.0000 mg | SUBCUTANEOUS | Status: DC
  Administered 2012-07-25: 40 mg via SUBCUTANEOUS
  Filled 2012-07-25: qty 0.4

## 2012-07-25 MED ORDER — DOCUSATE SODIUM 100 MG PO CAPS
100.0000 mg | ORAL_CAPSULE | Freq: Two times a day (BID) | ORAL | Status: DC
  Administered 2012-07-25 – 2012-07-26 (×4): 100 mg via ORAL
  Filled 2012-07-25 (×4): qty 1

## 2012-07-25 MED ORDER — PIPERACILLIN-TAZOBACTAM 3.375 G IVPB
INTRAVENOUS | Status: AC
  Filled 2012-07-25: qty 50

## 2012-07-25 MED ORDER — ACETAMINOPHEN 325 MG PO TABS: 650.0000 mg | ORAL_TABLET | Freq: Four times a day (QID) | ORAL | Status: DC | PRN

## 2012-07-25 MED ORDER — DEXTROSE 5 % IV SOLN
1.0000 g | Freq: Once | INTRAVENOUS | Status: AC
  Administered 2012-07-25: 1 g via INTRAVENOUS
  Filled 2012-07-25: qty 10

## 2012-07-25 MED ORDER — MORPHINE SULFATE 4 MG/ML IJ SOLN
4.0000 mg | INTRAMUSCULAR | Status: DC | PRN
  Administered 2012-07-25 (×2): 4 mg via INTRAVENOUS
  Filled 2012-07-25 (×2): qty 1

## 2012-07-25 MED ORDER — SODIUM CHLORIDE 0.9 % IV SOLN
INTRAVENOUS | Status: AC
  Administered 2012-07-25: 1000 mL via INTRAVENOUS

## 2012-07-25 MED ORDER — INSULIN ASPART 100 UNIT/ML ~~LOC~~ SOLN
0.0000 [IU] | Freq: Every day | SUBCUTANEOUS | Status: DC
  Administered 2012-07-25: 2 [IU] via SUBCUTANEOUS

## 2012-07-25 MED ORDER — PIPERACILLIN-TAZOBACTAM 3.375 G IVPB
3.3750 g | Freq: Three times a day (TID) | INTRAVENOUS | Status: DC
  Administered 2012-07-25 – 2012-07-26 (×4): 3.375 g via INTRAVENOUS
  Filled 2012-07-25 (×8): qty 50

## 2012-07-25 MED ORDER — HYDROMORPHONE HCL PF 1 MG/ML IJ SOLN
1.0000 mg | INTRAMUSCULAR | Status: DC | PRN
  Administered 2012-07-25 – 2012-07-26 (×5): 1 mg via INTRAVENOUS
  Filled 2012-07-25 (×5): qty 1

## 2012-07-25 MED ORDER — VANCOMYCIN HCL IN DEXTROSE 1-5 GM/200ML-% IV SOLN
1000.0000 mg | Freq: Once | INTRAVENOUS | Status: AC
  Administered 2012-07-25: 1000 mg via INTRAVENOUS
  Filled 2012-07-25: qty 200

## 2012-07-25 MED ORDER — INSULIN ASPART 100 UNIT/ML ~~LOC~~ SOLN
4.0000 [IU] | Freq: Three times a day (TID) | SUBCUTANEOUS | Status: DC
  Administered 2012-07-25 – 2012-07-26 (×4): 4 [IU] via SUBCUTANEOUS

## 2012-07-25 MED ORDER — POLYETHYLENE GLYCOL 3350 17 G PO PACK: 17.0000 g | PACK | Freq: Every day | ORAL | Status: DC | PRN

## 2012-07-25 MED ORDER — ALUM & MAG HYDROXIDE-SIMETH 200-200-20 MG/5ML PO SUSP: 30.0000 mL | Freq: Four times a day (QID) | ORAL | Status: DC | PRN

## 2012-07-25 MED ORDER — PNEUMOCOCCAL VAC POLYVALENT 25 MCG/0.5ML IJ INJ
0.5000 mL | INJECTION | INTRAMUSCULAR | Status: AC
  Administered 2012-07-26: 0.5 mL via INTRAMUSCULAR
  Filled 2012-07-25: qty 0.5

## 2012-07-25 MED ORDER — ASPIRIN 81 MG PO TABS
81.0000 mg | ORAL_TABLET | Freq: Every day | ORAL | Status: DC
  Filled 2012-07-25: qty 1

## 2012-07-25 NOTE — Progress Notes (Signed)
UR Chart Review Completed  

## 2012-07-25 NOTE — ED Notes (Signed)
Remains sleeping, family at bedside. Report called to floor and pt transported w/o incident

## 2012-07-25 NOTE — ED Notes (Signed)
sleeping

## 2012-07-25 NOTE — Progress Notes (Signed)
MD paged and made aware that pt received 4mg  of IV Morphine at 0800 for leg pain from cellulitis but pt still having severe pain. New order received. Will continue to monitor.

## 2012-07-25 NOTE — Progress Notes (Signed)
Dr Karilyn Cota called and made aware that pt still having pain 8 out of 10 on pain scale even after 1mg  IV Dilaudid given 45 minutes ago. New orders received. Pt updated. Will continue to monitor.

## 2012-07-25 NOTE — H&P (Signed)
[facility] History and Physical  Erik George WUJ:[id number] DOB: Jan 29, 1956 DOA: 07/24/2012  Referring physician: Eloise Levels PCP: No primary provider on file.  Specialists: none  Chief Complaint: Right Leg Pain, Swelling and Redness  HPI: Erik George is a 56 y.o. male with PMH significant for HTN, DM and Psoriasis who presented to [facility] complaining of right leg pain, redness and swelling that started this morning and a fever at home of 103. He has chronic extensor surface psoriatic plaques over his joints including his bilateral knees with areas of scaling, open skin and irritation. His right leg is red, hot, swollen and painful to touch. No lymphangitic spread, well demarcated border extending from his psoriasis lesions on that leg. Difficulty bearing weight. His daughters are with him in ED. No prior history of cellulitis, trauma or wound infections. He is not on any DMARDS, steroids or other immune suppression drugs.   Review of Systems: Review of Systems  Constitutional: Positive for fever, chills and malaise/fatigue.  Eyes: Negative.   Respiratory: Negative.   Cardiovascular: Positive for leg swelling.  Gastrointestinal: Negative.   Genitourinary: Negative.   Musculoskeletal: Positive for myalgias, back pain and joint pain.  Skin: Negative for itching and rash.  Neurological: Positive for weakness and headaches.  Endo/Heme/Allergies: Negative.   Psychiatric/Behavioral: Negative.   All other systems reviewed and are negative.     Past Medical History  Diagnosis Date  . Hypertension   . Diabetes mellitus without complication   . Psoriasis   . Meningitis, viral 2009    with [facility] Spotted Fever, occurred here at [facility]   Past Surgical History  Procedure Date  . Cholecystectomy    Social History:  reports that he has been smoking Cigarettes.  He has a 12.5 pack-year smoking history. He does not have any smokeless tobacco history on file. He reports that  he does not drink alcohol. His drug history not on file. Home, independently with two daughters.  Allergies  Allergen Reactions  . Codeine Palpitations    Family History  Problem Relation Age of Onset  . Cancer - Other Mother   . Cancer - Other Father   . Diabetes Father   . Cancer - Other Sister      Prior to Admission medications   Medication Sig Start Date End Date Taking? Authorizing Provider  aspirin 650 MG EC tablet Take 650 mg by mouth every 6 (six) hours as needed.   Yes Historical Provider, MD  aspirin 81 MG tablet Take 81 mg by mouth daily.   Yes Historical Provider, MD  fish oil-omega-3 fatty acids 1000 MG capsule Take 1 g by mouth daily.   Yes Historical Provider, MD  glyBURIDE-metformin (GLUCOVANCE) 2.5-500 MG per tablet Take 1 tablet by mouth 2 (two) times daily with a meal.   Yes Historical Provider, MD  metFORMIN (GLUCOPHAGE) 1000 MG tablet Take 1,000 mg by mouth 2 (two) times daily with a meal.   Yes Historical Provider, MD   Physical Exam: Filed Vitals:   07/24/12 2258 07/24/12 2300 07/25/12 0058 07/25/12 0234  BP: 124/57 110/57 107/48 108/61  Pulse: 87 88 95 74  Temp: 99.1 F (37.3 C)  98.8 F (37.1 C) 98.6 F (37 C)  TempSrc: Oral   Oral  Resp: 18   18  Height: 6' (1.829 m)   6' (1.829 m)  Weight: 109.317 kg (241 lb)   109.8 kg (242 lb 1 oz)  SpO2: 95% 96% 98% 97%  General:  Sleeping, easily awakens, AOx3, cooperative, NAD  Eyes: PERRL  ENT: normal inspection  Neck: normal inspection  Cardiovascular: RRR  Respiratory: CTAB  Abdomen: soft, NT  Skin: multiple extensor surface plaques, scaly, right lower leg is red, hot, swollen anteriorly, no posterior tenderness, no palpable ropes or cords, pulses normal, cap refill normal  Musculoskeletal: normal   Psychiatric: appropriate  Neurologic: non-focal, sensation intact in lower extremities  Labs on Admission:  Basic Metabolic Panel:  Lab 07/24/12 1191  NA 134*  K 3.4*  CL 99  CO2  25  GLUCOSE 117*  BUN 19  CREATININE 0.97  CALCIUM 8.8  MG --  PHOS --   Liver Function Tests:  Lab 07/24/12 2341  AST 16  ALT 20  ALKPHOS 31*  BILITOT 1.0  PROT 7.1  ALBUMIN 3.4*   CBC:  Lab 07/24/12 2341  WBC 12.9*  NEUTROABS 11.0*  HGB 12.6*  HCT 35.2*  MCV 87.8  PLT 124*    Assessment/Plan Principal Problem:  *Cellulitis Active Problems:  Psoriasis  Diabetes mellitus  Hypertension  1. Right Lower Extremity Cellulitis, early uncomplicated but has diabetes and his psoriasis puts him at risk for entry of both typical and atypical bacterial culprits.   Admit for observation and empiric IV antibiotics-Vancomycin and Zosyn for at least 24 hours, if improved and no systemic signs of infection these can be de-escalated to an oral regimen and patient can be discharged home with close outpatient follow-up.  Venous doppler ordered in ED to R/O possible DVT and one treatment dose Lovenox was given until study can be completed in AM.  2. DM, sliding scale insulin ordered and carb mod diet, resume home meds at discharge.  3. Hypertension, BP normal no medications required.  4. Psoriasis, fairly extensive, may benefit from outpatient dermatology referral. \ Code Status: Full Code Family Communication: Discussed plan of care with daughter and patient at teh bedside Disposition Plan: Observation admission 1-2 days for IV antibiotics and d/c home when medically stable.  Time spent: 50 minutes  [facility] Pager [phone]  If 7PM-7AM, please contact night-coverage [email or web address] Password [email or web address] 07/25/2012, 3:02 AM

## 2012-07-25 NOTE — Plan of Care (Signed)
Problem: Consults Goal: General Medical Patient Education See Patient Education Module for specific education. Outcome: Progressing Admitted tonight with Cellulitis to Right leg medial thigh down to ankle with pain, redness, and swelling Goal: Skin Care Protocol Initiated - if Braden Score 18 or less If consults are not indicated, leave blank or document N/A Outcome: Progressing Monitor site.  Patient has severe psoriasis over entire body, sacrum has skin tear to but crack due to psoriasis.  Navel, bilat elbows and knees hands and heels Goal: Diabetes Guidelines if Diabetic/Glucose > 140 If diabetic or lab glucose is > 140 mg/dl - Initiate Diabetes/Hyperglycemia Guidelines & Document Interventions  Outcome: Progressing NIDDM, takes metformin and glucovance  Problem: Phase I Progression Outcomes Goal: Pain controlled with appropriate interventions Outcome: Progressing Dilaudid 1mg  given iv in the ED Goal: OOB as tolerated unless otherwise ordered Outcome: Progressing Patient transferred from stretcher to bed on his own Goal: Initial discharge plan identified Outcome: Progressing Home lives with daughter

## 2012-07-25 NOTE — Care Management Note (Signed)
    Page 1 of 1   07/26/2012     9:18:09 AM   CARE MANAGEMENT NOTE 07/26/2012  Patient:  Erik George, Erik George   Account Number:  [id number]  Date Initiated:  07/25/2012  Documentation initiated by:  Rosemary Holms  Subjective/Objective Assessment:   Pt admitted with cellulitis of R. leg. Pt from [place] here visiting his child. Pt also has chronic psoriasis.     Action/Plan:   Anticipated DC Date:  07/27/2012   Anticipated DC Plan:  HOME/SELF CARE      DC Planning Services  CM consult      Choice offered to / List presented to:             Status of service:  Completed, signed off Medicare Important Message given?   (If response is "NO", the following Medicare IM given date fields will be blank) Date Medicare IM given:   Date Additional Medicare IM given:    Discharge Disposition:  HOME/SELF CARE  Per UR Regulation:    If discussed at Long Length of Stay Meetings, dates discussed:    Comments:  07/26/12 Rosemary Holms RN BSN CM DC to return home to [place] and fu w/ PCP and Dermatologist.  07/25/12 Rosemary Holms RN BSN CM

## 2012-07-25 NOTE — Progress Notes (Signed)
ANTIBIOTIC CONSULT NOTE - INITIAL  Pharmacy Consult for Vancomycin Indication: cellulitis of LE  Allergies  Allergen Reactions  . Codeine Palpitations    Patient Measurements: Height: 6' (182.9 cm) Weight: 242 lb 1 oz (109.8 kg) IBW/kg (Calculated) : 77.6   Vital Signs: Temp: 98.6 F (37 C) (10/28 0234) Temp src: Oral (10/28 0234) BP: 108/61 mmHg (10/28 0234) Pulse Rate: 74  (10/28 0234) Intake/Output from previous day: 10/27 0701 - 10/28 0700 In: 316.3 [I.V.:266.3; IV Piggyback:50] Out: -  Intake/Output from this shift:    Labs:  [facility] 07/25/12 0514 07/24/12 2341  WBC 10.5 12.9*  HGB 11.5* 12.6*  PLT 113* 124*  LABCREA -- --  CREATININE 0.98 0.97   Estimated Creatinine Clearance: 107.7 ml/min (by C-G formula based on Cr of 0.98). No results found for this basename: VANCOTROUGH:2,VANCOPEAK:2,VANCORANDOM:2,GENTTROUGH:2,GENTPEAK:2,GENTRANDOM:2,TOBRATROUGH:2,TOBRAPEAK:2,TOBRARND:2,AMIKACINPEAK:2,AMIKACINTROU:2,AMIKACIN:2, in the last 72 hours   Microbiology: Recent Results (from the past 720 hour(s))  CULTURE, BLOOD (ROUTINE X 2)     Status: Normal (Preliminary result)   Collection Time   07/24/12 11:41 PM      Component Value Range Status Comment   Specimen Description BLOOD RIGHT ANTECUBITAL   Final    Special Requests     Final    Value: BOTTLES DRAWN AEROBIC AND ANAEROBIC AEB 10CC ANA 8CC   Culture PENDING   Incomplete    Report Status PENDING   Incomplete   CULTURE, BLOOD (ROUTINE X 2)     Status: Normal (Preliminary result)   Collection Time   07/24/12 11:47 PM      Component Value Range Status Comment   Specimen Description BLOOD RIGHT HAND   Final    Special Requests BOTTLES DRAWN AEROBIC AND ANAEROBIC 8CC EACH   Final    Culture PENDING   Incomplete    Report Status PENDING   Incomplete   MRSA PCR SCREENING     Status: Normal   Collection Time   07/25/12  2:19 AM      Component Value Range Status Comment   MRSA by PCR NEGATIVE  NEGATIVE Final       Medical History: Past Medical History  Diagnosis Date  . Hypertension   . Diabetes mellitus without complication   . Psoriasis   . Meningitis, viral 2009    with [facility] Spotted Fever, occurred here at [facility]    Medications:  Scheduled:    . sodium chloride   Intravenous Once  . sodium chloride   Intravenous STAT  . aspirin EC  81 mg Oral Daily  . cefTRIAXone (ROCEPHIN) IVPB 1 gram/50 mL D5W  1 g Intravenous Once  . docusate sodium  100 mg Oral BID  . enoxaparin  100 mg Subcutaneous Once  . enoxaparin (LOVENOX) injection  40 mg Subcutaneous Q24H  .  HYDROmorphone (DILAUDID) injection  1 mg Intravenous Once  . insulin aspart  0-15 Units Subcutaneous TID WC  . insulin aspart  0-5 Units Subcutaneous QHS  . insulin aspart  4 Units Subcutaneous TID WC  . omega-3 acid ethyl esters  1 g Oral Daily  . ondansetron  4 mg Intravenous Once  . piperacillin-tazobactam (ZOSYN)  IV  3.375 g Intravenous Q8H  . pneumococcal 23 valent vaccine  0.5 mL Intramuscular Tomorrow-1000  . potassium chloride  40 mEq Oral Once  . vancomycin  1,000 mg Intravenous Once  . vancomycin  1,000 mg Intravenous Q8H  . DISCONTD: aspirin  81 mg Oral Daily   Assessment: 56yo male admitted with cellulitis of  LE.  Obese with good renal fxn.  Estimated Creatinine Clearance: 107.7 ml/min (by C-G formula based on Cr of 0.98).  Anticipate discharge tomorrow per MD note so will not order trough at this point.  Goal of Therapy:  Vancomycin trough level 10-15 mcg/ml  Plan: Vancomycin 1gm iv q8hrs Monitor patient progress  Valrie Hart A 07/25/2012,8:04 AM

## 2012-07-25 NOTE — Progress Notes (Signed)
This very pleasant 56 year old man was admitted yesterday with right lower leg cellulitis. Unfortunately, he has significant psoriasis affecting the right knee area, the left knee area and both elbow areas, classical distribution for psoriasis. He is not clinically septic. Vital signs are stable.  Plan: 1. Continue with intravenous antibiotics. 2. Probable discharge home tomorrow as I anticipate improvement in his cellulitis, whereupon he can continue treatment with outpatient oral antibiotics.

## 2012-07-25 NOTE — Plan of Care (Signed)
Problem: Problem: Skin/Wound Progression Goal: ADEQUATE MOBILITY PROGRESSION Outcome: Progressing Patient hesitant to get oob onto Right leg due to pain rated an 8.  Described as sharp radiating from thigh to ankle and a deep pain. Goal: APPROPRIATE NUTRITIONAL STATUS Outcome: Progressing Albumin level on the low side may benefit from supplements Goal: OTHER SKIN/WOUND GOAL(S) Outcome: Progressing Uses psoriasis creme at home.  Over the counter.

## 2012-07-26 DIAGNOSIS — L408 Other psoriasis: Secondary | ICD-10-CM

## 2012-07-26 DIAGNOSIS — E119 Type 2 diabetes mellitus without complications: Secondary | ICD-10-CM

## 2012-07-26 DIAGNOSIS — I10 Essential (primary) hypertension: Secondary | ICD-10-CM

## 2012-07-26 LAB — COMPREHENSIVE METABOLIC PANEL
AST: 27 U/L (ref 0–37)
Albumin: 3 g/dL — ABNORMAL LOW (ref 3.5–5.2)
Alkaline Phosphatase: 42 U/L (ref 39–117)
BUN: 19 mg/dL (ref 6–23)
CO2: 25 mEq/L (ref 19–32)
Chloride: 103 mEq/L (ref 96–112)
GFR calc non Af Amer: >90 mL/min (ref >90)
Potassium: 4.1 mEq/L (ref 3.5–5.1)
Total Bilirubin: 0.5 mg/dL (ref 0.3–1.2)

## 2012-07-26 LAB — CBC
HCT: 33.8 % — ABNORMAL LOW (ref 39.0–52.0)
Hemoglobin: 11.6 g/dL — ABNORMAL LOW (ref 13.0–17.0)
MCV: 88.9 fL (ref 78.0–100.0)
RBC: 3.8 MIL/uL — ABNORMAL LOW (ref 4.22–5.81)
RDW: 13.3 % (ref 11.5–15.5)
WBC: 10.1 10*3/uL (ref 4.0–10.5)

## 2012-07-26 LAB — GLUCOSE, CAPILLARY: Glucose-Capillary: 192 mg/dL — ABNORMAL HIGH (ref 70–99)

## 2012-07-26 MED ORDER — OXYCODONE HCL 5 MG PO TABS: 5.0000 mg | ORAL_TABLET | ORAL | Status: DC | PRN

## 2012-07-26 MED ORDER — AMOXICILLIN-POT CLAVULANATE 875-125 MG PO TABS: 1.0000 | ORAL_TABLET | Freq: Two times a day (BID) | ORAL | Status: DC

## 2012-07-26 NOTE — Progress Notes (Signed)
[facility] INTENSIVE CARE UN[facility] [id number] [place] [address] Phone: [phone] Fax: [phone]  July 26, 2012  Patient: Erik George  Date of Birth: June 19, 1956  Date of Visit: 07/24/2012    To Whom It May Concern:  Erik George was seen and treated in our hospital on 07/24/2012 and discharged on 07/26/2012. Erik George  can return to work on 08/01/2012.  Sincerely,

## 2012-07-26 NOTE — Progress Notes (Signed)
Discharge instructions and medication prescriptions gone over with patient and family member using teach back method. All questions and concerns answered. Pt discharged via wheelchair with family member.

## 2012-07-26 NOTE — Discharge Summary (Signed)
Physician Discharge Summary  Erik George:[id number] DOB: June 09, 1956 DOA: 07/24/2012   Admit date: 07/24/2012 Discharge date: 07/26/2012  Time spent: Less than 30 minutes  Recommendations for Outpatient Follow-up:  1. Follow with primary care physician in [place] as well as dermatologist for the extensive psoriasis.   Discharge Diagnoses: 1. Cellulitis of the right lower leg, improving. 2. Extensive cellulitis affecting both knee areas and elbow areas. 3. Type 2 diabetes mellitus. 4. Hypertension.   Discharge Condition: Stable and improving.  Diet recommendation: Carbohydrate modified diet.  Filed Weights   07/24/12 2258 07/25/12 0234  Weight: 109.317 kg (241 lb) 109.8 kg (242 lb 1 oz)    History of present illness:  This very pleasant 56 year old presented to the hospital with symptoms of right leg, swelling and redness. Please see initial history as outlined below: HPI: Erik George is a 56 y.o. male with PMH significant for HTN, DM and Psoriasis who presented to [facility] complaining of right leg pain, redness and swelling that started this morning and a fever at home of 103. He has chronic extensor surface psoriatic plaques over his joints including his bilateral knees with areas of scaling, open skin and irritation. His right leg is red, hot, swollen and painful to touch. No lymphangitic spread, well demarcated border extending from his psoriasis lesions on that leg. Difficulty bearing weight. His daughters are with him in ED. No prior history of cellulitis, trauma or wound infections. He is not on any DMARDS, steroids or other immune suppression drugs.  Hospital Course:  Patient was treated empirically with intravenous Zosyn and vancomycin. Initially he was in quite significant pain but overnight he done extremely well and his cellulitis also has improved. He has not had a fever and really has not been toxic clinically. His white count has improved also. He is now stable  to be discharged home. The psoriasis is really a major problem and I have asked him to followup with this with his dermatologist.  Procedures:  None.   Consultations:  None.  Discharge Exam: Filed Vitals:   07/25/12 0234 07/25/12 1338 07/25/12 2100 07/26/12 0458  BP: 108/61 107/66 135/74 118/79  Pulse: 74 69 63 64  Temp: 98.6 F (37 C) 99 F (37.2 C) 98 F (36.7 C) 97.9 F (36.6 C)  TempSrc: Oral Oral Oral Oral  Resp: 18 18 20 18   Height: 6' (1.829 m)     Weight: 109.8 kg (242 lb 1 oz)     SpO2: 97% 96% 94% 96%    General: He looks systemically well. Is not toxic or septic. Cardiovascular: Heart sounds are present and normal. No murmurs. Respiratory: Lung fields show scattered wheezing which is his baseline. There is no crackles or bronchial breathing. There is no increased work of breathing. He is alert and orientated. The right lower leg cellulitis clearly has improved although still somewhat present. The rest of his treatment can be continued as an outpatient.  Discharge Instructions  Discharge Orders    Future Orders Please Complete By Expires   Diet - low sodium heart healthy      Increase activity slowly          Medication List     As of 07/26/2012  7:52 AM    TAKE these medications         amoxicillin-clavulanate 875-125 MG per tablet   Commonly known as: AUGMENTIN   Take 1 tablet by mouth 2 (two) times daily.      aspirin 81  MG tablet   Take 81 mg by mouth daily.      fish oil-omega-3 fatty acids 1000 MG capsule   Take 1 g by mouth daily.      glyBURIDE-metformin 2.5-500 MG per tablet   Commonly known as: GLUCOVANCE   Take 1 tablet by mouth 2 (two) times daily with a meal.      metFORMIN 1000 MG tablet   Commonly known as: GLUCOPHAGE   Take 1,000 mg by mouth 2 (two) times daily with a meal.      multivitamin with minerals Tabs   Take 1 tablet by mouth daily.      oxyCODONE 5 MG immediate release tablet   Commonly known as: Oxy  IR/ROXICODONE   Take 1 tablet (5 mg total) by mouth every 4 (four) hours as needed for pain.          The results of significant diagnostics from this hospitalization (including imaging, microbiology, ancillary and laboratory) are listed below for reference.    Significant Diagnostic Studies: US Venous Img Lower Unilateral Right  07/25/2012  *RADIOLOGY REPORT*  Clinical Data: leg pain;;  RIGHT LOWER EXTREMITY VENOUS DUPLEX ULTRASOUND  Technique: Gray-scale sonography with compression, as well as color and duplex ultrasound, were performed to evaluate the deep venous system from the level of the common femoral vein through the popliteal and proximal calf veins.  Comparison: None  Findings:  Normal compressibility and normal Doppler signal within the common femoral, superficial femoral and popliteal veins, down to the proximal calf veins.  No grayscale filling defects to suggest DVT.  Mildly prominent right inguinal lymph nodes.  Small fluid collection in the popliteal fossa compatible with Baker's cyst measuring maximally 1.7 cm.  IMPRESSION: No evidence of right lower extremity deep vein thrombosis.   Original Report Authenticated By: Cyndie Chime, M.D.     Microbiology: Recent Results (from the past 240 hour(s))  CULTURE, BLOOD (ROUTINE X 2)     Status: Normal (Preliminary result)   Collection Time   07/24/12 11:41 PM      Component Value Range Status Comment   Specimen Description BLOOD RIGHT ANTECUBITAL   Final    Special Requests     Final    Value: BOTTLES DRAWN AEROBIC AND ANAEROBIC AEB 10CC ANA 8CC   Culture NO GROWTH 1 DAY   Final    Report Status PENDING   Incomplete   CULTURE, BLOOD (ROUTINE X 2)     Status: Normal (Preliminary result)   Collection Time   07/24/12 11:47 PM      Component Value Range Status Comment   Specimen Description BLOOD RIGHT HAND   Final    Special Requests BOTTLES DRAWN AEROBIC AND ANAEROBIC 8CC EACH   Final    Culture NO GROWTH 1 DAY   Final     Report Status PENDING   Incomplete   MRSA PCR SCREENING     Status: Normal   Collection Time   07/25/12  2:19 AM      Component Value Range Status Comment   MRSA by PCR NEGATIVE  NEGATIVE Final      Labs: Basic Metabolic Panel:  Lab 07/26/12 1610 07/25/12 0514 07/24/12 2341  NA 136 136 134*  K 4.1 3.4* 3.4*  CL 103 101 99  CO2 25 27 25   GLUCOSE 215* 94 117*  BUN 19 18 19   CREATININE 0.84 0.98 0.97  CALCIUM 8.7 8.6 8.8  MG -- -- --  PHOS -- -- --  Liver Function Tests:  Lab 07/26/12 0443 07/24/12 2341  AST 27 16  ALT 51 20  ALKPHOS 42 31*  BILITOT 0.5 1.0  PROT 7.0 7.1  ALBUMIN 3.0* 3.4*     CBC:  Lab 07/26/12 0443 07/25/12 0514 07/24/12 2341  WBC 10.1 10.5 12.9*  NEUTROABS -- -- 11.0*  HGB 11.6* 11.5* 12.6*  HCT 33.8* 33.0* 35.2*  MCV 88.9 88.9 87.8  PLT 136* 113* 124*     CBG:  Lab 07/26/12 0735 07/25/12 2115 07/25/12 1613 07/25/12 1156 07/25/12 0757  GLUCAP 187* 213* 148* 121* 111*       Signed:  GOSRANI,NIMISH C  [facility] 07/26/2012, 7:52 AM

## 2012-07-29 LAB — CULTURE, BLOOD (ROUTINE X 2)

## 2014-07-15 IMAGING — US US EXTREM LOW VENOUS*R*
1 series · 14 of 24 positions shown · non-contrast
Comparison: None

CLINICAL DATA: leg pain;;

RIGHT LOWER EXTREMITY VENOUS DUPLEX ULTRASOUND
TECHNIQUE: Gray-scale sonography with compression, as well as color
and duplex ultrasound, were performed to evaluate the deep venous
system from the level of the common femoral vein through the
popliteal and proximal calf veins.

[Series 1: us extrem low venous*right* · 0.13mm/px · 14 of 36 slices shown]
[im 1/36]
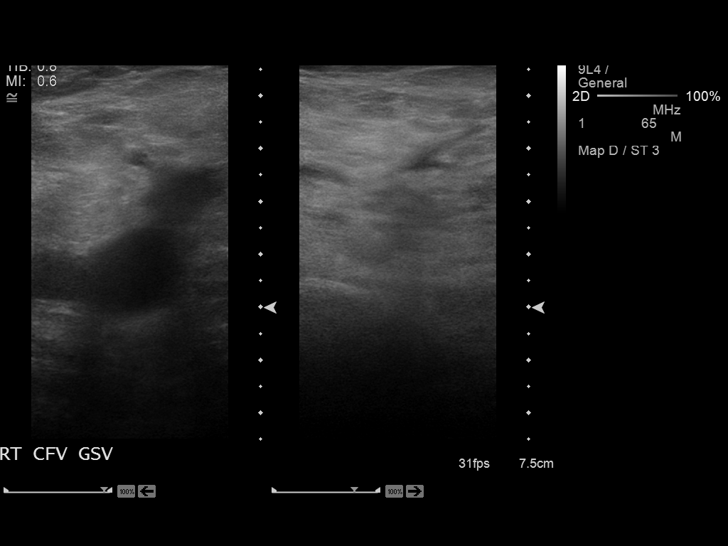
[im 4/36]
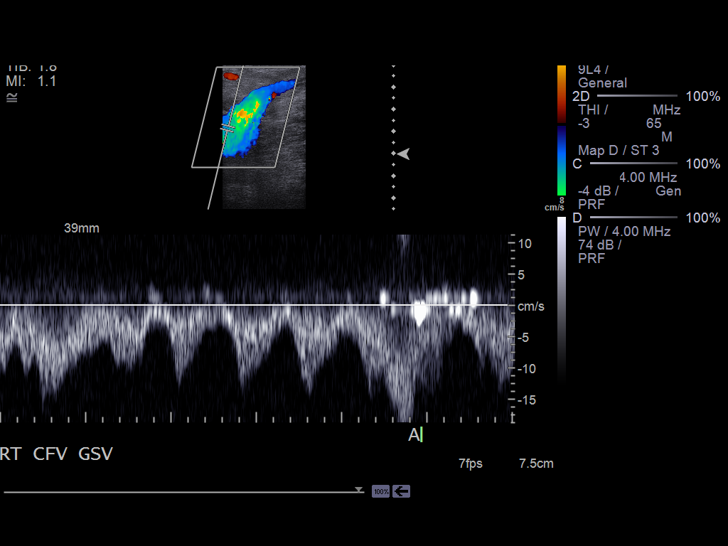
[im 7/36]
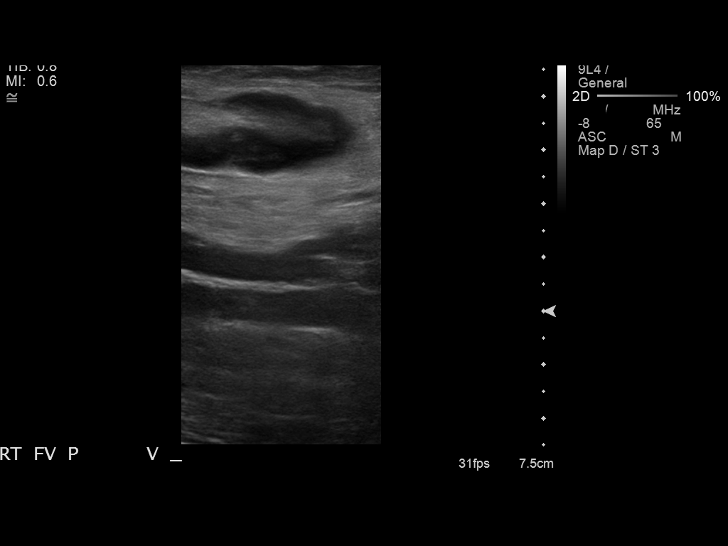
[im 10/36]
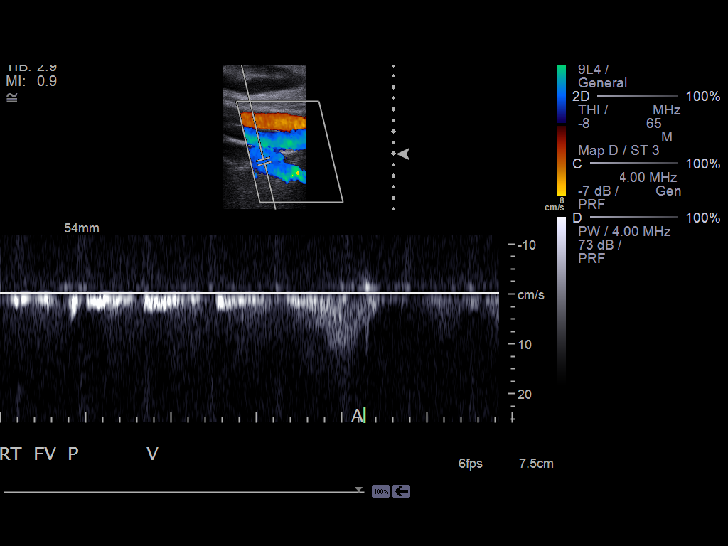
[im 11/36]
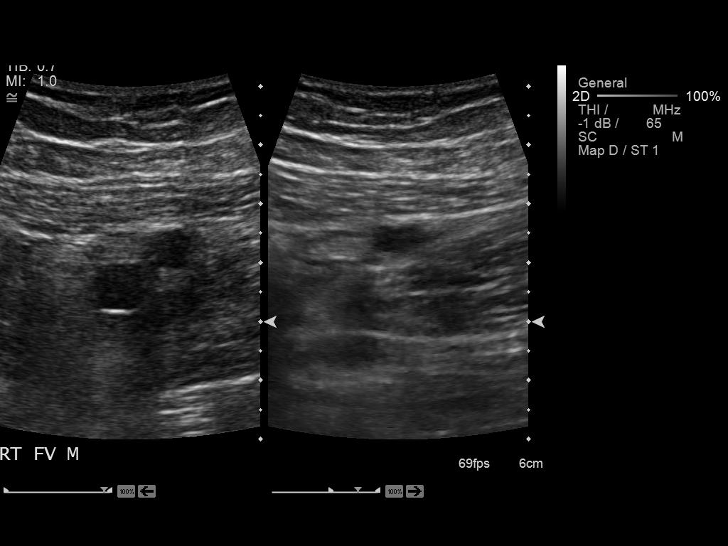
[im 14/36]
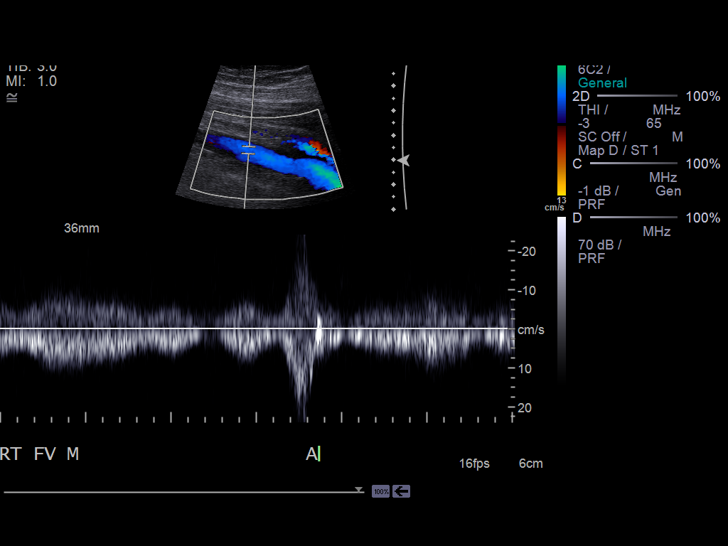
[im 17/36]
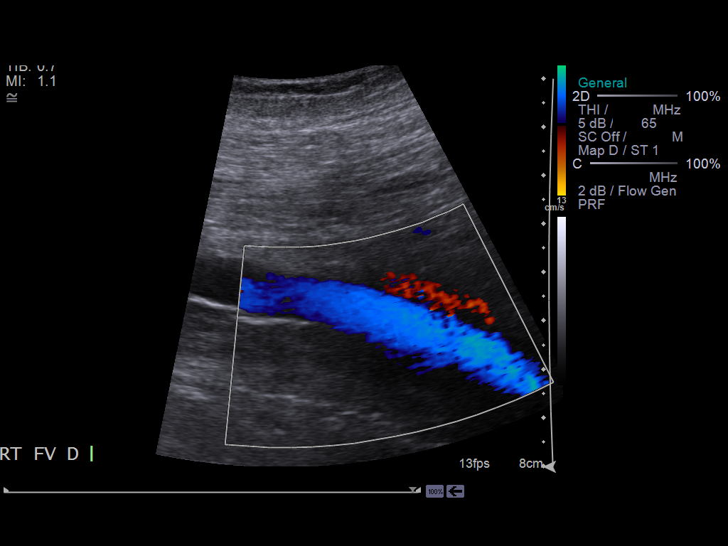
[im 19/36]
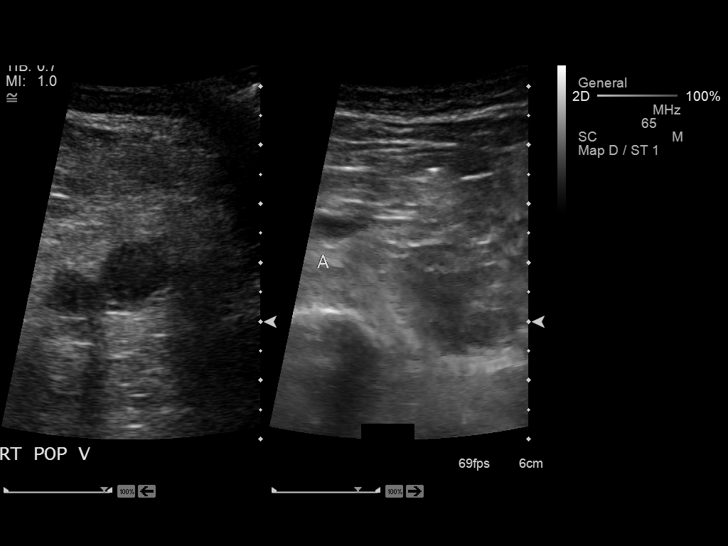
[im 22/36]
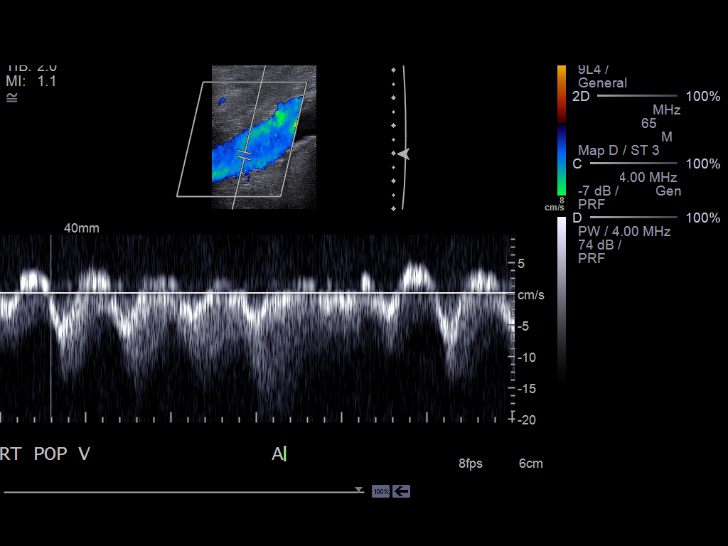
[im 25/36]
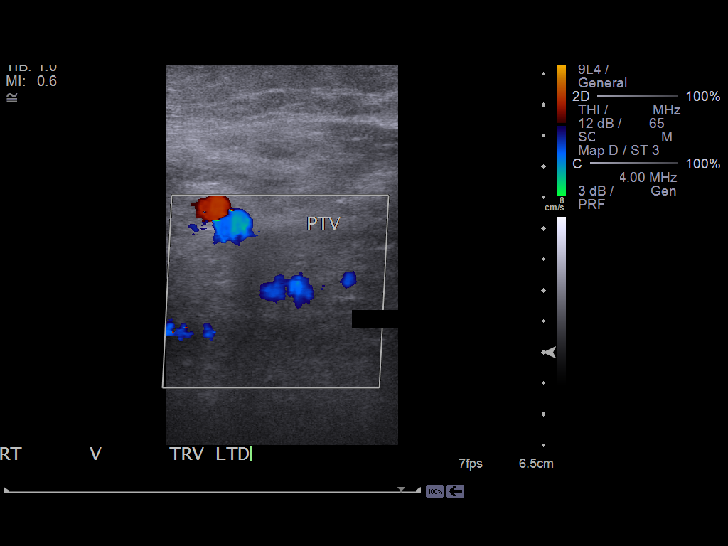
[im 28/36]
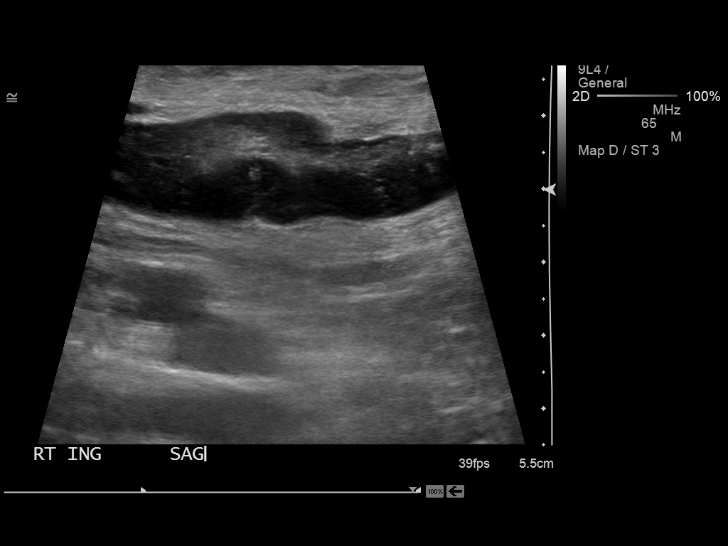
[im 29/36]
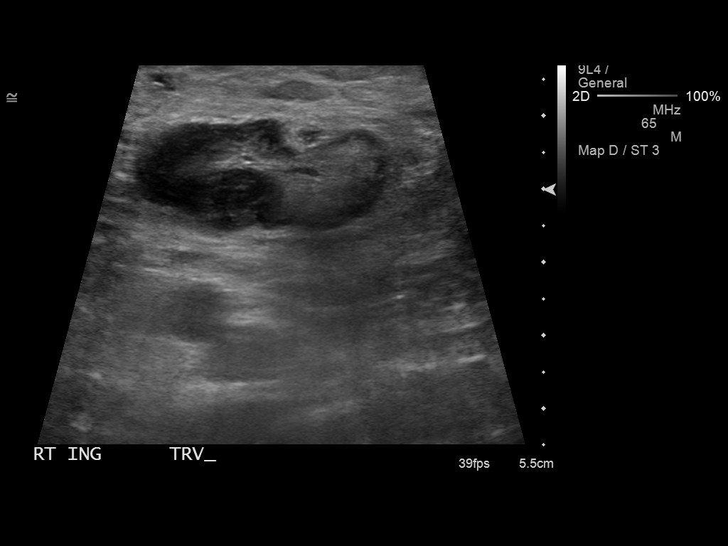
[im 32/36]
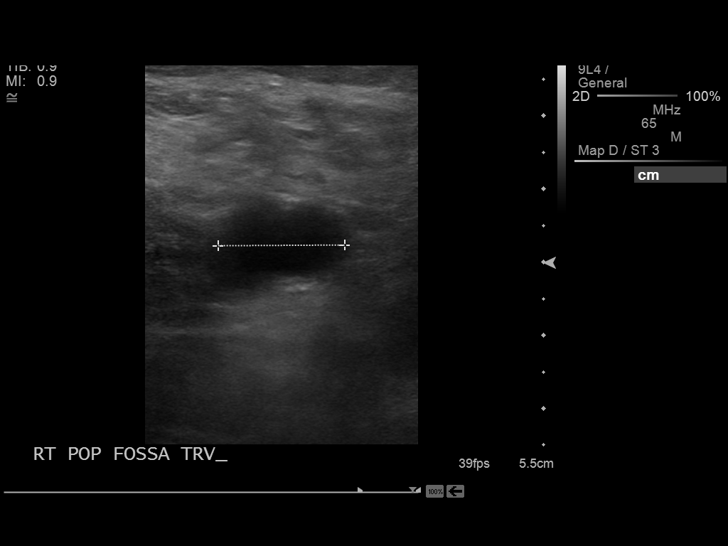
[im 36/36]
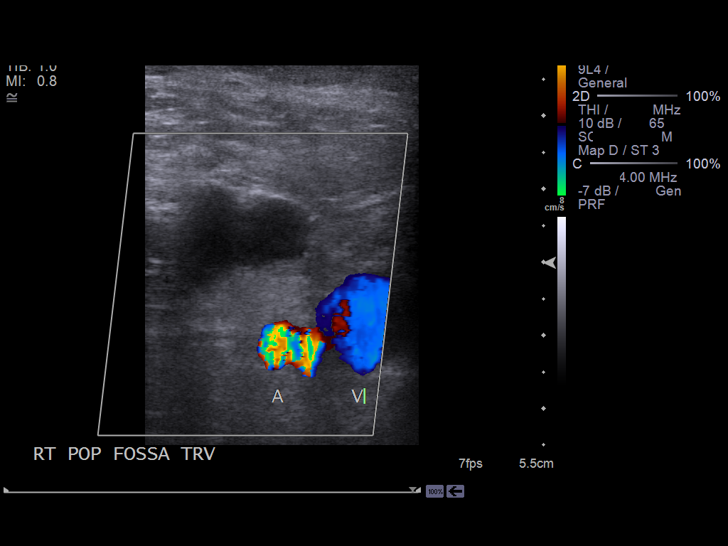

[14 of 24 positions shown; findings below may reference images not displayed]

FINDINGS: Normal compressibility and normal Doppler signal within
the common femoral, superficial femoral and popliteal veins, down
to the proximal calf veins.  No grayscale filling defects to
suggest DVT.

Mildly prominent right inguinal lymph nodes.  Small fluid
collection in the popliteal fossa compatible with Baker's cyst
measuring maximally 1.7 cm.
IMPRESSION: No evidence of right lower extremity deep vein thrombosis.

## 2014-10-24 ENCOUNTER — Encounter (HOSPITAL_COMMUNITY): Payer: Self-pay

## 2014-10-24 ENCOUNTER — Emergency Department (HOSPITAL_COMMUNITY)
Admission: EM | Admit: 2014-10-24 | Discharge: 2014-10-24 | Disposition: A | Discharge status: Home / Self Care | Payer: Self-pay | Attending: Emergency Medicine | Admitting: Emergency Medicine

## 2014-10-24 DIAGNOSIS — Z79899 Other long term (current) drug therapy: Secondary | ICD-10-CM | POA: Insufficient documentation

## 2014-10-24 DIAGNOSIS — Z720 Tobacco use: Secondary | ICD-10-CM | POA: Insufficient documentation

## 2014-10-24 DIAGNOSIS — I10 Essential (primary) hypertension: Secondary | ICD-10-CM | POA: Insufficient documentation

## 2014-10-24 DIAGNOSIS — E119 Type 2 diabetes mellitus without complications: Secondary | ICD-10-CM | POA: Insufficient documentation

## 2014-10-24 DIAGNOSIS — Z872 Personal history of diseases of the skin and subcutaneous tissue: Secondary | ICD-10-CM | POA: Insufficient documentation

## 2014-10-24 DIAGNOSIS — B029 Zoster without complications: Secondary | ICD-10-CM | POA: Insufficient documentation

## 2014-10-24 DIAGNOSIS — Z7982 Long term (current) use of aspirin: Secondary | ICD-10-CM | POA: Insufficient documentation

## 2014-10-24 DIAGNOSIS — Z794 Long term (current) use of insulin: Secondary | ICD-10-CM | POA: Insufficient documentation

## 2014-10-24 HISTORY — DX: Zoster without complications: B02.9

## 2014-10-24 MED ORDER — ACYCLOVIR 400 MG PO TABS: 400.0000 mg | ORAL_TABLET | Freq: Four times a day (QID) | ORAL | Status: DC

## 2014-10-24 MED ORDER — OXYCODONE-ACETAMINOPHEN 5-325 MG PO TABS
2.0000 | ORAL_TABLET | Freq: Once | ORAL | Status: AC
  Administered 2014-10-24: 2 via ORAL
  Filled 2014-10-24: qty 2

## 2014-10-24 MED ORDER — OXYCODONE-ACETAMINOPHEN 5-325 MG PO TABS
2.0000 | ORAL_TABLET | ORAL | Status: DC | PRN
Start: 2014-10-24 — End: 2017-06-12

## 2014-10-24 NOTE — ED Provider Notes (Signed)
CSN: [id number]     Arrival date & time 10/24/14  1911 History   First MD Initiated Contact with Patient 10/24/14 1935     Chief Complaint  Patient presents with  . Flank Pain  . Rash    Patient is a 59 y.o. male presenting with rash. The history is provided by the patient.  Rash Location: left flank. Quality: painful   Pain details:    Severity:  Mild   Onset quality:  Gradual   Duration:  1 day   Timing:  Constant   Progression:  Worsening Onset quality:  Sudden Timing:  Constant Progression:  Worsening Chronicity:  New Relieved by:  Nothing Exacerbated by: movement. Associated symptoms: no fever and not vomiting   Patient reports for past several days he has had left flank pain.  He also reports area has "felt numb" No fever/vomiting No falls/trauma No other abdominal pain No back pain No cp/sob  Today he noticed a rash appear in the area of pain   Past Medical History  Diagnosis Date  . Hypertension   . Diabetes mellitus without complication   . Psoriasis   . Meningitis, viral 2009    with [facility] Spotted Fever, occurred here at [facility]  . Shingles    Past Surgical History  Procedure Laterality Date  . Cholecystectomy     Family History  Problem Relation Age of Onset  . Cancer - Other Mother   . Cancer - Other Father   . Diabetes Father   . Cancer - Other Sister    History  Substance Use Topics  . Smoking status: Current Every Day Smoker -- 0.50 packs/day for 25 years    Types: Cigarettes  . Smokeless tobacco: Not on file  . Alcohol Use: Yes     Comment: occasional    Review of Systems  Constitutional: Negative for fever.  Cardiovascular: Negative for chest pain.  Gastrointestinal: Negative for vomiting.  Genitourinary: Positive for flank pain. Negative for dysuria.  Skin: Positive for rash.  All other systems reviewed and are negative.     Allergies  Augmentin and Codeine  Home Medications   Prior to Admission medications    Medication Sig Start Date End Date Taking? Authorizing Provider  amLODipine (NORVASC) 10 MG tablet Take 10 mg by mouth daily.   Yes Historical Provider, MD  aspirin EC 81 MG tablet Take 81 mg by mouth daily.   Yes Historical Provider, MD  fish oil-omega-3 fatty acids 1000 MG capsule Take 1 g by mouth daily.   Yes Historical Provider, MD  gabapentin (NEURONTIN) 300 MG capsule Take 300 mg by mouth 2 (two) times daily.   Yes Historical Provider, MD  insulin detemir (LEVEMIR) 100 UNIT/ML injection Inject 60 Units into the skin at bedtime.   Yes Historical Provider, MD  insulin NPH Human (HUMULIN N,NOVOLIN N) 100 UNIT/ML injection Inject 10 Units into the skin 3 (three) times daily before meals.   Yes Historical Provider, MD  lisinopril-hydrochlorothiazide (PRINZIDE,ZESTORETIC) 20-12.5 MG per tablet Take 1 tablet by mouth daily.   Yes Historical Provider, MD  Multiple Vitamin (MULTIVITAMIN WITH MINERALS) TABS Take 1 tablet by mouth daily.   Yes Historical Provider, MD  quinapril-hydrochlorothiazide (ACCURETIC) 20-25 MG per tablet Take 1 tablet by mouth daily.   Yes Historical Provider, MD  sitaGLIPtin-metformin (JANUMET) 50-1000 MG per tablet Take 1 tablet by mouth 2 (two) times daily with a meal.   Yes Historical Provider, MD  acyclovir (ZOVIRAX) 400 MG tablet Take  1 tablet (400 mg total) by mouth 4 (four) times daily. 10/24/14   Sharyon Cable, MD  oxyCODONE-acetaminophen (PERCOCET/ROXICET) 5-325 MG per tablet Take 2 tablets by mouth every 4 (four) hours as needed for severe pain. 10/24/14   Sharyon Cable, MD   BP 140/78 mmHg  Pulse 87  Temp(Src) 98.6 F (37 C) (Oral)  Resp 16  Ht 6' (1.829 m)  Wt 242 lb (109.77 kg)  BMI 32.81 kg/m2  SpO2 97% Physical Exam CONSTITUTIONAL: Well developed/well nourished HEAD: Normocephalic/atraumatic EYES: EOMI/PERRL ENMT: Mucous membranes moist NECK: supple no meningeal signs SPINE/BACK:entire spine nontender CV: S1/S2 noted, no murmurs/rubs/gallops  noted LUNGS: Lungs are clear to auscultation bilaterally, no apparent distress ABDOMEN: soft, nontender, no rebound or guarding, bowel sounds noted throughout abdomen. He is obese.   GU:no cva tenderness. No tenderness on palaption NEURO: Pt is awake/alert/appropriate, moves all extremitiesx4.  No facial droop.   EXTREMITIES: pulses normal/equal, full ROM SKIN: warm, color normal. Area of erythematous/herpetic rash to left lower flank.  It is in a dermatomal distribution PSYCH: no abnormalities of mood noted, alert and oriented to situation  ED Course  Procedures   Suspect herpes zoster Pt well appearing and appropriate for d/c home  Medications  oxyCODONE-acetaminophen (PERCOCET/ROXICET) 5-325 MG per tablet 2 tablet (2 tablets Oral Given 10/24/14 2012)     MDM   Final diagnoses:  Herpes zoster    Nursing notes including past medical history and social history reviewed and considered in documentation     Sharyon Cable, MD 10/24/14 2153

## 2014-10-24 NOTE — ED Notes (Signed)
Having left side pain. Hurting from left hip up to left chest. Feels numb in one spot. Denies any bladder or bladder problems. Denies trouble breathing or chest pain. Hurts to lay down. Not resting due to the discomfort. Hurts worse when laying down. Patient states that he has a rash on his left side. Patient states that he has a history of shingles and does not think that it is shingles.

## 2014-10-24 NOTE — Discharge Instructions (Signed)

## 2017-06-12 ENCOUNTER — Encounter (HOSPITAL_COMMUNITY): Payer: Self-pay | Admitting: Emergency Medicine

## 2017-06-12 ENCOUNTER — Emergency Department (HOSPITAL_COMMUNITY): Payer: Self-pay

## 2017-06-12 ENCOUNTER — Emergency Department (HOSPITAL_COMMUNITY)
Admission: EM | Admit: 2017-06-12 | Discharge: 2017-06-12 | Disposition: A | Discharge status: Home / Self Care | Payer: Self-pay | Attending: Emergency Medicine | Admitting: Emergency Medicine

## 2017-06-12 DIAGNOSIS — Z7982 Long term (current) use of aspirin: Secondary | ICD-10-CM | POA: Insufficient documentation

## 2017-06-12 DIAGNOSIS — Z87891 Personal history of nicotine dependence: Secondary | ICD-10-CM | POA: Insufficient documentation

## 2017-06-12 DIAGNOSIS — Z79899 Other long term (current) drug therapy: Secondary | ICD-10-CM | POA: Insufficient documentation

## 2017-06-12 DIAGNOSIS — I10 Essential (primary) hypertension: Secondary | ICD-10-CM | POA: Insufficient documentation

## 2017-06-12 DIAGNOSIS — E119 Type 2 diabetes mellitus without complications: Secondary | ICD-10-CM | POA: Insufficient documentation

## 2017-06-12 DIAGNOSIS — I252 Old myocardial infarction: Secondary | ICD-10-CM | POA: Insufficient documentation

## 2017-06-12 DIAGNOSIS — C799 Secondary malignant neoplasm of unspecified site: Secondary | ICD-10-CM

## 2017-06-12 DIAGNOSIS — L409 Psoriasis, unspecified: Secondary | ICD-10-CM | POA: Insufficient documentation

## 2017-06-12 DIAGNOSIS — R1013 Epigastric pain: Secondary | ICD-10-CM

## 2017-06-12 HISTORY — DX: Acute myocardial infarction, unspecified: I21.9

## 2017-06-12 LAB — LIPASE, BLOOD: Lipase: 149 U/L — ABNORMAL HIGH (ref 11–51)

## 2017-06-12 LAB — BASIC METABOLIC PANEL
Anion gap: 10 (ref 5–15)
BUN: 17 mg/dL (ref 6–20)
CALCIUM: 8.8 mg/dL — AB (ref 8.9–10.3)
CHLORIDE: 99 mmol/L — AB (ref 101–111)
CO2: 23 mmol/L (ref 22–32)
Creatinine, Ser: 1.05 mg/dL (ref 0.61–1.24)
Glucose, Bld: 200 mg/dL — ABNORMAL HIGH (ref 65–99)
Potassium: 4.2 mmol/L (ref 3.5–5.1)
Sodium: 132 mmol/L — ABNORMAL LOW (ref 135–145)

## 2017-06-12 LAB — CBC
HCT: 32.8 % — ABNORMAL LOW (ref 39.0–52.0)
Hemoglobin: 10.8 g/dL — ABNORMAL LOW (ref 13.0–17.0)
MCH: 27.5 pg (ref 26.0–34.0)
MCHC: 32.9 g/dL (ref 30.0–36.0)
MCV: 83.5 fL (ref 78.0–100.0)
Platelets: 255 10*3/uL (ref 150–400)
RBC: 3.93 MIL/uL — ABNORMAL LOW (ref 4.22–5.81)
RDW: 13.4 % (ref 11.5–15.5)
WBC: 11.4 10*3/uL — ABNORMAL HIGH (ref 4.0–10.5)

## 2017-06-12 LAB — HEPATIC FUNCTION PANEL
ALT: 95 U/L — AB (ref 17–63)
AST: 90 U/L — ABNORMAL HIGH (ref 15–41)
Albumin: 3.1 g/dL — ABNORMAL LOW (ref 3.5–5.0)
Alkaline Phosphatase: 89 U/L (ref 38–126)
BILIRUBIN DIRECT: 0.3 mg/dL (ref 0.1–0.5)
Indirect Bilirubin: 0.7 mg/dL (ref 0.3–0.9)
TOTAL PROTEIN: 8.6 g/dL — AB (ref 6.5–8.1)
Total Bilirubin: 1 mg/dL (ref 0.3–1.2)

## 2017-06-12 LAB — I-STAT TROPONIN, ED: TROPONIN I, POC: 0 ng/mL (ref 0.00–0.08)

## 2017-06-12 MED ORDER — OXYCODONE-ACETAMINOPHEN 5-325 MG PO TABS: 1.0000 | ORAL_TABLET | ORAL | 0 refills | Status: AC | PRN

## 2017-06-12 MED ORDER — OXYCODONE-ACETAMINOPHEN 5-325 MG PO TABS
1.0000 | ORAL_TABLET | Freq: Once | ORAL | Status: AC
  Administered 2017-06-12: 1 via ORAL
  Filled 2017-06-12: qty 1

## 2017-06-12 MED ORDER — IOPAMIDOL (ISOVUE-300) INJECTION 61%
INTRAVENOUS | Status: AC
  Administered 2017-06-12: 100 mL
  Filled 2017-06-12: qty 100

## 2017-06-12 MED ORDER — ONDANSETRON 4 MG PO TBDP
8.0000 mg | ORAL_TABLET | Freq: Once | ORAL | Status: AC
  Administered 2017-06-12: 8 mg via ORAL
  Filled 2017-06-12: qty 2

## 2017-06-12 NOTE — ED Triage Notes (Signed)
Pt c/o epigastric pain x 2 weeks. States pain is generally worse after eating, but has been feeling an increased shortness of breath with exertion.
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
## 2017-06-12 NOTE — Discharge Instructions (Signed)
The testing which was done today is concerning for a finding of likely pancreatic cancer with metastases to the liver.  To clarify the diagnosis, you will need to have a biopsy of one of the lesions.  This can be done either by a surgeon, or possibly by an interventional radiologist.  Since he lives in [place], it will be best to get this evaluation done there, and initiate appropriate treatment.  The best way to begin care is to contact your primary care doctor on Monday and have him help you arrange to get a biopsy done.  We are giving you an optic disk, with the images from the CT scan which was done today.  We will also give you a copy of the laboratory testing.  If your doctor is able to access EPIC, they can see the test results.  Otherwise you will need to get copies of your records sent from our medical records department, so that your doctors can see them.

## 2017-09-28 DEATH — deceased

## 2019-06-02 IMAGING — CR DG CHEST 2V
2 series · 2 of 2 positions shown · non-contrast
Comparison: 02/24/08

CLINICAL DATA: Shortness of breath for 2 weeks.

EXAM:
CHEST  2 VIEW

[chest pa]
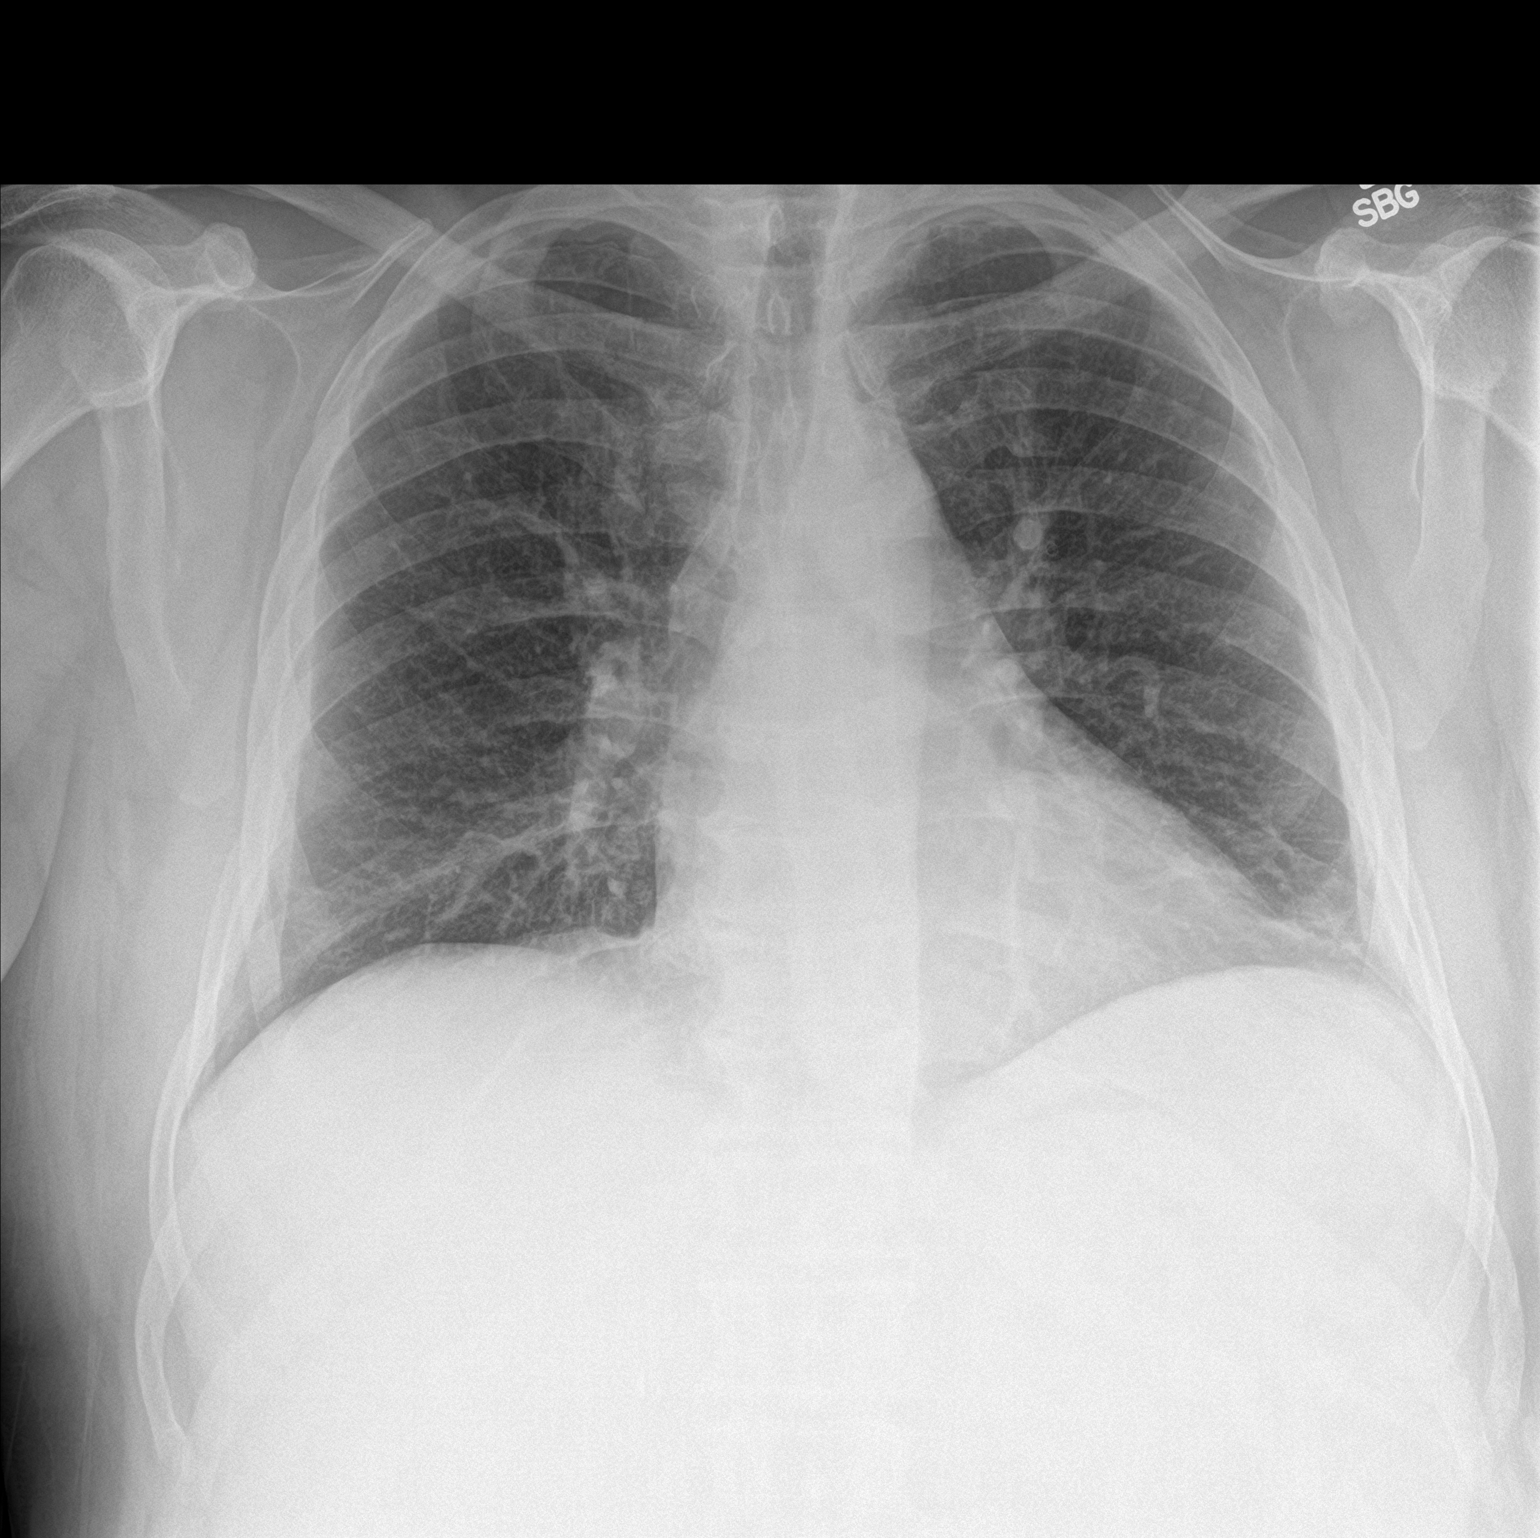

[chest lat]
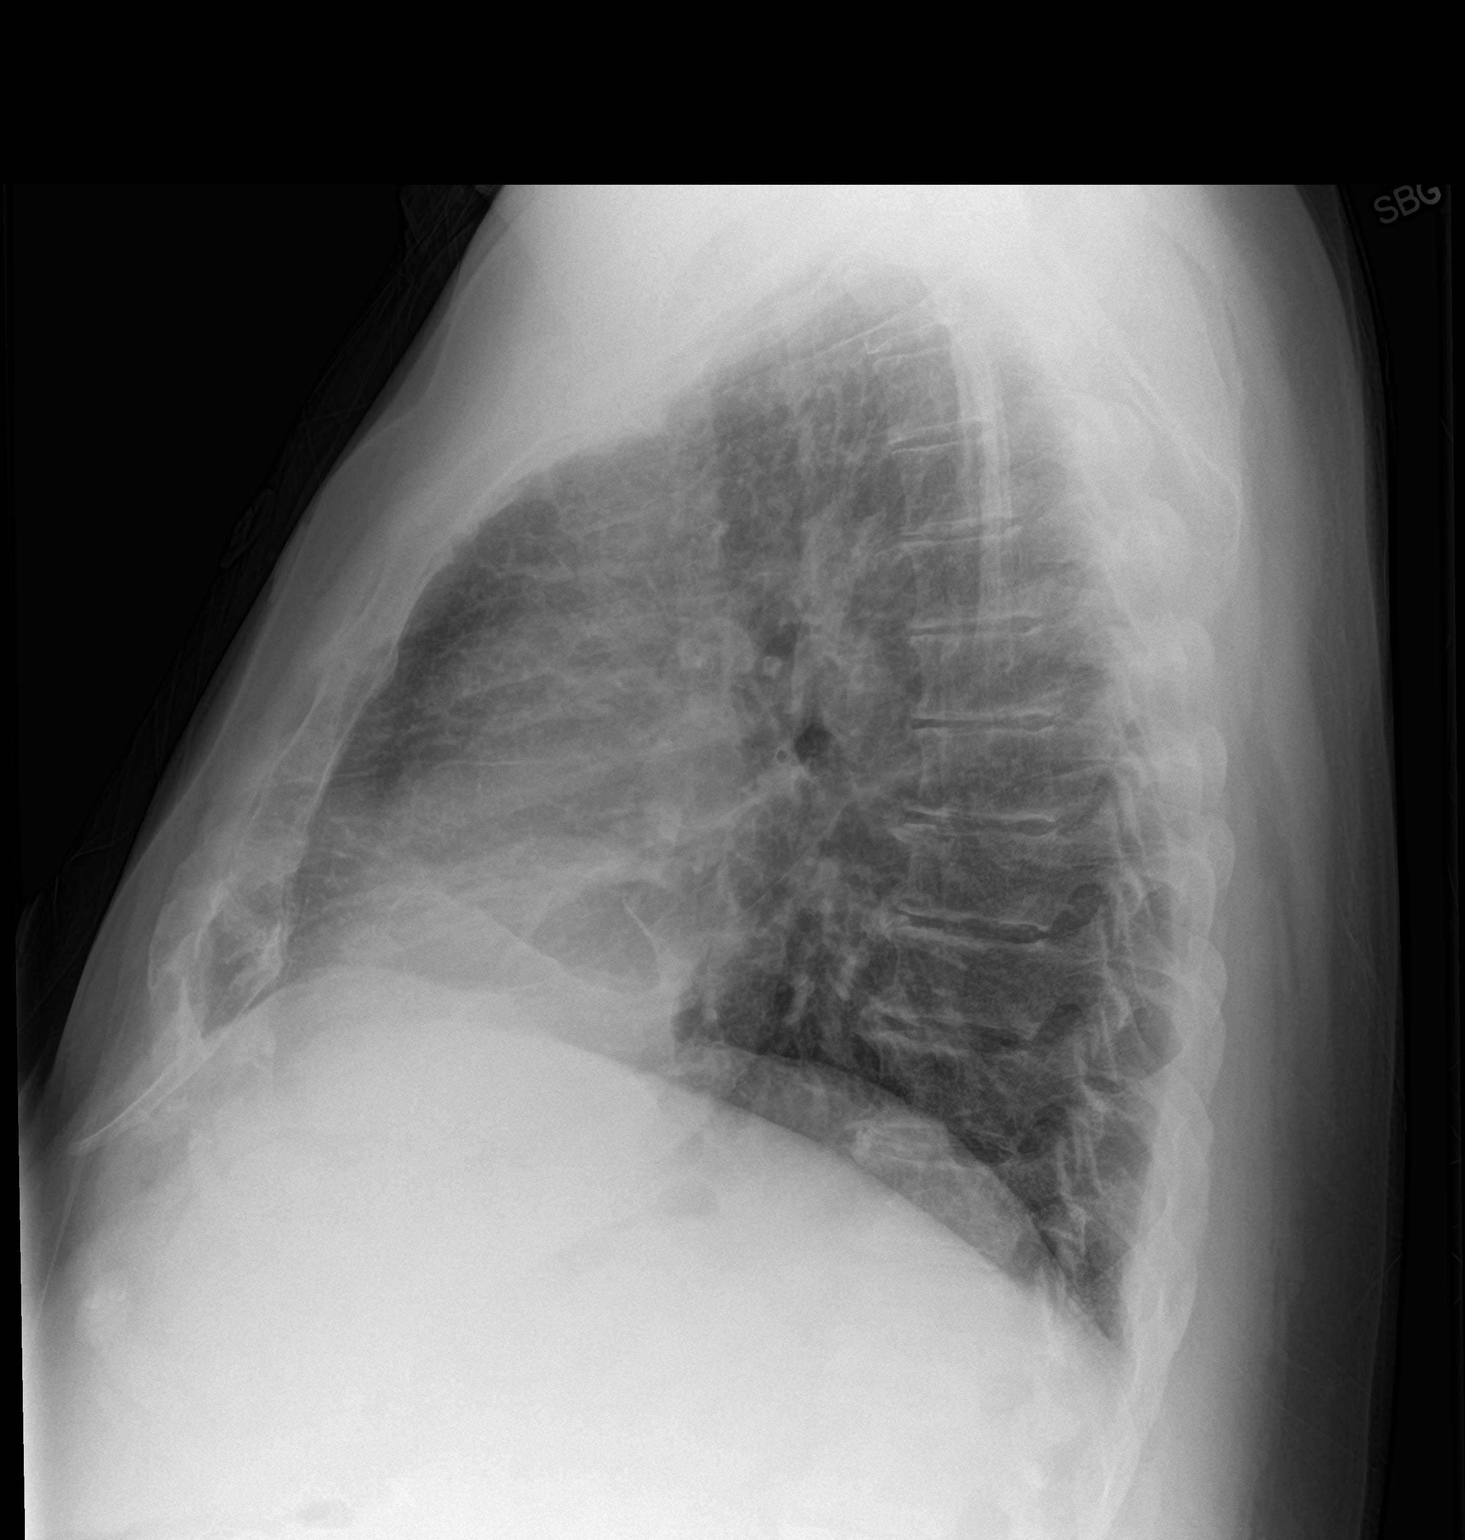

[2 of 2 positions shown; findings below may reference images not displayed]

FINDINGS: The heart size and mediastinal contours are within normal limits.
Both lungs are clear. Lingular scarring is again noted. The
visualized skeletal structures are unremarkable.
IMPRESSION: 1. No acute findings.
2. Scarring within the lingula.
